# Patient Record
Sex: Female | Born: 1935 | Race: Black or African American | Hispanic: No | Marital: Single | Smoking: Never smoker
Health system: Southern US, Community
[De-identification: ages and names within clinical notes are randomized; demographics above are authoritative.]

## PROBLEM LIST (undated history)

## (undated) DIAGNOSIS — F0390 Unspecified dementia without behavioral disturbance: Secondary | ICD-10-CM

## (undated) DIAGNOSIS — I509 Heart failure, unspecified: Secondary | ICD-10-CM

## (undated) DIAGNOSIS — D696 Thrombocytopenia, unspecified: Secondary | ICD-10-CM

## (undated) DIAGNOSIS — I442 Atrioventricular block, complete: Secondary | ICD-10-CM

## (undated) DIAGNOSIS — E039 Hypothyroidism, unspecified: Secondary | ICD-10-CM

## (undated) DIAGNOSIS — R410 Disorientation, unspecified: Secondary | ICD-10-CM

## (undated) DIAGNOSIS — C55 Malignant neoplasm of uterus, part unspecified: Secondary | ICD-10-CM

## (undated) DIAGNOSIS — E785 Hyperlipidemia, unspecified: Secondary | ICD-10-CM

## (undated) DIAGNOSIS — I4891 Unspecified atrial fibrillation: Secondary | ICD-10-CM

## (undated) DIAGNOSIS — D539 Nutritional anemia, unspecified: Secondary | ICD-10-CM

## (undated) DIAGNOSIS — E876 Hypokalemia: Secondary | ICD-10-CM

## (undated) DIAGNOSIS — Z90710 Acquired absence of both cervix and uterus: Secondary | ICD-10-CM

## (undated) DIAGNOSIS — K219 Gastro-esophageal reflux disease without esophagitis: Secondary | ICD-10-CM

## (undated) HISTORY — DX: Acquired absence of both cervix and uterus: Z90.710

## (undated) HISTORY — DX: Nutritional anemia, unspecified: D53.9

## (undated) HISTORY — DX: Hypokalemia: E87.6

## (undated) HISTORY — DX: Thrombocytopenia, unspecified: D69.6

## (undated) HISTORY — DX: Hypothyroidism, unspecified: E03.9

## (undated) HISTORY — DX: Atrioventricular block, complete: I44.2

## (undated) HISTORY — DX: Heart failure, unspecified: I50.9

## (undated) HISTORY — DX: Unspecified dementia, unspecified severity, without behavioral disturbance, psychotic disturbance, mood disturbance, and anxiety: F03.90

## (undated) HISTORY — DX: Unspecified atrial fibrillation: I48.91

## (undated) HISTORY — DX: Hyperlipidemia, unspecified: E78.5

## (undated) HISTORY — DX: Malignant neoplasm of uterus, part unspecified: C55

## (undated) HISTORY — DX: Disorientation, unspecified: R41.0

## (undated) HISTORY — DX: Gastro-esophageal reflux disease without esophagitis: K21.9

## (undated) HISTORY — PX: CATARACT EXTRACTION: SUR2

---

## 1998-01-26 ENCOUNTER — Other Ambulatory Visit: Admission: RE | Admit: 1998-01-26 | Discharge: 1998-01-26 | Payer: Self-pay | Admitting: Hematology and Oncology

## 1999-02-21 ENCOUNTER — Encounter: Payer: Self-pay | Admitting: Emergency Medicine

## 1999-02-21 ENCOUNTER — Emergency Department (HOSPITAL_COMMUNITY): Admission: EM | Admit: 1999-02-21 | Discharge: 1999-02-21 | Payer: Self-pay | Admitting: Emergency Medicine

## 2000-01-07 ENCOUNTER — Emergency Department (HOSPITAL_COMMUNITY): Admission: EM | Admit: 2000-01-07 | Discharge: 2000-01-07 | Payer: Self-pay | Admitting: Emergency Medicine

## 2000-01-31 ENCOUNTER — Ambulatory Visit (HOSPITAL_COMMUNITY): Admission: RE | Admit: 2000-01-31 | Discharge: 2000-01-31 | Payer: Self-pay | Admitting: Cardiology

## 2000-01-31 ENCOUNTER — Encounter: Payer: Self-pay | Admitting: Cardiology

## 2001-01-09 ENCOUNTER — Encounter: Payer: Self-pay | Admitting: Emergency Medicine

## 2001-01-09 ENCOUNTER — Emergency Department (HOSPITAL_COMMUNITY): Admission: EM | Admit: 2001-01-09 | Discharge: 2001-01-09 | Payer: Self-pay | Admitting: Emergency Medicine

## 2001-01-25 ENCOUNTER — Encounter: Payer: Self-pay | Admitting: Ophthalmology

## 2001-01-28 ENCOUNTER — Ambulatory Visit (HOSPITAL_COMMUNITY): Admission: RE | Admit: 2001-01-28 | Discharge: 2001-01-28 | Payer: Self-pay | Admitting: Ophthalmology

## 2001-03-19 ENCOUNTER — Ambulatory Visit (HOSPITAL_COMMUNITY): Admission: RE | Admit: 2001-03-19 | Discharge: 2001-03-19 | Payer: Self-pay | Admitting: Ophthalmology

## 2001-09-08 HISTORY — PX: PACEMAKER INSERTION: SHX728

## 2002-07-25 ENCOUNTER — Inpatient Hospital Stay (HOSPITAL_COMMUNITY): Admission: AD | Admit: 2002-07-25 | Discharge: 2002-07-27 | Payer: Self-pay | Admitting: Cardiology

## 2002-07-25 ENCOUNTER — Encounter: Payer: Self-pay | Admitting: Cardiology

## 2002-07-27 ENCOUNTER — Encounter: Payer: Self-pay | Admitting: Internal Medicine

## 2003-03-06 ENCOUNTER — Ambulatory Visit (HOSPITAL_COMMUNITY): Admission: RE | Admit: 2003-03-06 | Discharge: 2003-03-06 | Payer: Self-pay | Admitting: Cardiology

## 2003-03-09 HISTORY — PX: CARDIAC CATHETERIZATION: SHX172

## 2003-03-15 ENCOUNTER — Ambulatory Visit (HOSPITAL_COMMUNITY): Admission: RE | Admit: 2003-03-15 | Discharge: 2003-03-15 | Payer: Self-pay | Admitting: Cardiology

## 2003-03-15 ENCOUNTER — Encounter: Payer: Self-pay | Admitting: Cardiology

## 2003-03-28 ENCOUNTER — Ambulatory Visit (HOSPITAL_COMMUNITY): Admission: RE | Admit: 2003-03-28 | Discharge: 2003-03-28 | Payer: Self-pay | Admitting: Cardiology

## 2003-04-26 ENCOUNTER — Encounter: Payer: Self-pay | Admitting: Ophthalmology

## 2003-04-28 ENCOUNTER — Ambulatory Visit (HOSPITAL_COMMUNITY): Admission: RE | Admit: 2003-04-28 | Discharge: 2003-04-28 | Payer: Self-pay | Admitting: Ophthalmology

## 2004-07-15 ENCOUNTER — Ambulatory Visit: Payer: Self-pay

## 2005-03-22 ENCOUNTER — Emergency Department (HOSPITAL_COMMUNITY): Admission: EM | Admit: 2005-03-22 | Discharge: 2005-03-22 | Payer: Self-pay | Admitting: Emergency Medicine

## 2005-03-31 ENCOUNTER — Encounter: Admission: RE | Admit: 2005-03-31 | Discharge: 2005-03-31 | Payer: Self-pay | Admitting: Family Medicine

## 2005-04-08 ENCOUNTER — Ambulatory Visit: Payer: Self-pay | Admitting: Internal Medicine

## 2005-04-09 ENCOUNTER — Inpatient Hospital Stay (HOSPITAL_COMMUNITY): Admission: EM | Admit: 2005-04-09 | Discharge: 2005-04-11 | Payer: Self-pay | Admitting: Emergency Medicine

## 2005-05-08 ENCOUNTER — Ambulatory Visit: Payer: Self-pay | Admitting: Internal Medicine

## 2005-06-05 ENCOUNTER — Encounter: Admission: RE | Admit: 2005-06-05 | Discharge: 2005-06-05 | Payer: Self-pay | Admitting: Family Medicine

## 2005-07-01 ENCOUNTER — Ambulatory Visit (HOSPITAL_COMMUNITY): Admission: RE | Admit: 2005-07-01 | Discharge: 2005-07-01 | Payer: Self-pay | Admitting: Family Medicine

## 2005-07-15 ENCOUNTER — Encounter: Admission: RE | Admit: 2005-07-15 | Discharge: 2005-07-15 | Payer: Self-pay | Admitting: Family Medicine

## 2005-07-29 ENCOUNTER — Ambulatory Visit (HOSPITAL_COMMUNITY): Admission: RE | Admit: 2005-07-29 | Discharge: 2005-07-29 | Payer: Self-pay | Admitting: Family Medicine

## 2005-10-20 ENCOUNTER — Emergency Department (HOSPITAL_COMMUNITY): Admission: EM | Admit: 2005-10-20 | Discharge: 2005-10-20 | Payer: Self-pay | Admitting: Emergency Medicine

## 2005-10-21 ENCOUNTER — Encounter: Admission: RE | Admit: 2005-10-21 | Discharge: 2005-10-21 | Payer: Self-pay | Admitting: Family Medicine

## 2006-01-08 ENCOUNTER — Ambulatory Visit: Payer: Self-pay | Admitting: Internal Medicine

## 2006-01-13 ENCOUNTER — Emergency Department (HOSPITAL_COMMUNITY): Admission: EM | Admit: 2006-01-13 | Discharge: 2006-01-13 | Payer: Self-pay | Admitting: Emergency Medicine

## 2006-03-09 ENCOUNTER — Ambulatory Visit: Payer: Self-pay | Admitting: Internal Medicine

## 2006-05-28 ENCOUNTER — Encounter: Payer: Self-pay | Admitting: Vascular Surgery

## 2006-05-28 ENCOUNTER — Ambulatory Visit (HOSPITAL_COMMUNITY): Admission: RE | Admit: 2006-05-28 | Discharge: 2006-05-28 | Payer: Self-pay | Admitting: Family Medicine

## 2006-06-25 ENCOUNTER — Ambulatory Visit (HOSPITAL_COMMUNITY): Admission: RE | Admit: 2006-06-25 | Discharge: 2006-06-25 | Payer: Self-pay | Admitting: Family Medicine

## 2006-09-22 ENCOUNTER — Ambulatory Visit: Payer: Self-pay | Admitting: Internal Medicine

## 2006-10-06 ENCOUNTER — Encounter: Payer: Self-pay | Admitting: Cardiology

## 2006-10-06 ENCOUNTER — Ambulatory Visit: Payer: Self-pay

## 2008-09-21 ENCOUNTER — Encounter (INDEPENDENT_AMBULATORY_CARE_PROVIDER_SITE_OTHER): Payer: Self-pay | Admitting: *Deleted

## 2008-12-08 ENCOUNTER — Emergency Department (HOSPITAL_COMMUNITY): Admission: EM | Admit: 2008-12-08 | Discharge: 2008-12-08 | Payer: Self-pay | Admitting: Emergency Medicine

## 2009-01-15 ENCOUNTER — Encounter (INDEPENDENT_AMBULATORY_CARE_PROVIDER_SITE_OTHER): Payer: Self-pay | Admitting: *Deleted

## 2009-01-15 ENCOUNTER — Ambulatory Visit: Payer: Self-pay | Admitting: *Deleted

## 2009-01-15 ENCOUNTER — Ambulatory Visit: Payer: Self-pay | Admitting: Cardiology

## 2009-01-15 ENCOUNTER — Inpatient Hospital Stay (HOSPITAL_COMMUNITY): Admission: EM | Admit: 2009-01-15 | Discharge: 2009-01-18 | Payer: Self-pay | Admitting: Emergency Medicine

## 2009-01-17 ENCOUNTER — Encounter (INDEPENDENT_AMBULATORY_CARE_PROVIDER_SITE_OTHER): Payer: Self-pay | Admitting: *Deleted

## 2009-02-16 ENCOUNTER — Emergency Department (HOSPITAL_COMMUNITY): Admission: EM | Admit: 2009-02-16 | Discharge: 2009-02-16 | Payer: Self-pay | Admitting: Emergency Medicine

## 2009-08-03 ENCOUNTER — Emergency Department (HOSPITAL_COMMUNITY): Admission: EM | Admit: 2009-08-03 | Discharge: 2009-08-03 | Payer: Self-pay | Admitting: Emergency Medicine

## 2009-10-11 ENCOUNTER — Ambulatory Visit (HOSPITAL_COMMUNITY): Admission: RE | Admit: 2009-10-11 | Discharge: 2009-10-11 | Payer: Self-pay | Admitting: Geriatric Medicine

## 2009-11-14 ENCOUNTER — Inpatient Hospital Stay (HOSPITAL_COMMUNITY): Admission: EM | Admit: 2009-11-14 | Discharge: 2009-11-16 | Payer: Self-pay | Admitting: Emergency Medicine

## 2010-04-11 ENCOUNTER — Emergency Department (HOSPITAL_COMMUNITY): Admission: EM | Admit: 2010-04-11 | Discharge: 2010-04-11 | Payer: Self-pay | Admitting: Emergency Medicine

## 2010-04-26 ENCOUNTER — Encounter (INDEPENDENT_AMBULATORY_CARE_PROVIDER_SITE_OTHER): Payer: Self-pay | Admitting: *Deleted

## 2010-06-06 ENCOUNTER — Encounter: Payer: Self-pay | Admitting: Internal Medicine

## 2010-06-07 ENCOUNTER — Emergency Department (HOSPITAL_COMMUNITY): Admission: EM | Admit: 2010-06-07 | Discharge: 2010-06-07 | Payer: Self-pay | Admitting: Emergency Medicine

## 2010-07-05 ENCOUNTER — Encounter (INDEPENDENT_AMBULATORY_CARE_PROVIDER_SITE_OTHER): Payer: Self-pay | Admitting: *Deleted

## 2010-08-20 ENCOUNTER — Ambulatory Visit: Payer: Self-pay | Admitting: Internal Medicine

## 2010-08-20 DIAGNOSIS — D539 Nutritional anemia, unspecified: Secondary | ICD-10-CM | POA: Insufficient documentation

## 2010-08-20 DIAGNOSIS — E039 Hypothyroidism, unspecified: Secondary | ICD-10-CM

## 2010-08-20 DIAGNOSIS — R404 Transient alteration of awareness: Secondary | ICD-10-CM

## 2010-08-20 DIAGNOSIS — I4891 Unspecified atrial fibrillation: Secondary | ICD-10-CM

## 2010-08-20 DIAGNOSIS — E876 Hypokalemia: Secondary | ICD-10-CM | POA: Insufficient documentation

## 2010-08-20 DIAGNOSIS — D696 Thrombocytopenia, unspecified: Secondary | ICD-10-CM | POA: Insufficient documentation

## 2010-08-20 DIAGNOSIS — Z950 Presence of cardiac pacemaker: Secondary | ICD-10-CM

## 2010-08-20 DIAGNOSIS — E785 Hyperlipidemia, unspecified: Secondary | ICD-10-CM | POA: Insufficient documentation

## 2010-08-20 DIAGNOSIS — K219 Gastro-esophageal reflux disease without esophagitis: Secondary | ICD-10-CM | POA: Insufficient documentation

## 2010-10-08 ENCOUNTER — Other Ambulatory Visit: Payer: Self-pay | Admitting: Geriatric Medicine

## 2010-10-08 DIAGNOSIS — Z780 Asymptomatic menopausal state: Secondary | ICD-10-CM

## 2010-10-10 NOTE — Cardiovascular Report (Signed)
Summary: Certified Letter Signed - Other  Certified Letter Signed - Other   Imported By: Debby Freiberg 07/23/2010 11:25:21  _____________________________________________________________________  External Attachment:    Type:   Image     Comment:   External Document

## 2010-10-10 NOTE — Assessment & Plan Note (Signed)
Summary: device check/mt   Visit Type:  Follow-up   History of Present Illness: [name] returns today for followup. She is a pleasant elderly woman with a h/o symptomatic bradycardia, s/p PPM.  She has a h/o atrial fibrillation with a paced ventricular rate.  She denies syncope, c/p or sob.  Current Medications (verified): 1)  Multivitamins   Tabs (Multiple Vitamin) .... Once Daily 2)  Triamcinolone Acetonide 0.025 % Crea (Triamcinolone Acetonide) .... Uad 3)  Potassium Chloride Crys Cr 20 Meq Cr-Tabs (Potassium Chloride Crys Cr) .... Take One Tablet By Mouth Daily 4)  Iron 246 (28 Fe) Mg Tabs (Ferrous Gluconate) .... Uad 5)  Cvs Cranberry 475 Mg Caps (Cranberry) .... Uad 6)  Artificial Tears  Soln (Artificial Tear Solution) .... Uad 7)  Refresh .... Uad 8)  Donepezil Hcl 5 Mg Tabs (Donepezil Hcl) .... Once Daily 9)  Glycolax  Powd (Polyethylene Glycol 3350) .... Uad 10)  Nexium 40 Mg Cpdr (Esomeprazole Magnesium) .... Once Daily 11)  Synthroid 50 Mcg Tabs (Levothyroxine Sodium) .... Once Daily 12)  Flonase 50 Mcg/act Susp (Fluticasone Propionate) .... Uad 13)  Vitamin D3 1000 Unit Tabs (Cholecalciferol) .... Once Daily 14)  Tramadol Hcl 50 Mg Tabs (Tramadol Hcl) .... As Needed  Allergies (verified): No Known Drug Allergies  Past History:  Past Medical History: Last updated: 08/20/2010 Current Problems:  GERD (ICD-530.81) DYSLIPIDEMIA (ICD-272.4) FIBRILLATION, ATRIAL (ICD-427.31) THROMBOCYTOPENIA (ICD-287.5) ANEMIA, MACROCYTIC (ICD-281.9) HYPOKALEMIA (ICD-276.8) HYPOTHYROIDISM (ICD-244.9) DELIRIUM (ICD-780.09)    Review of Systems  The patient denies chest pain, syncope, dyspnea on exertion, and peripheral edema.    Vital Signs:  Patient profile:   77 year old female Weight:      128 pounds Pulse rate:   72 / minute BP sitting:   118 / 62  (left arm)  Vitals Entered By: Laurance Flatten CMA (August 20, 2010 3:23 PM)  Physical Exam  General:  Elderly, well  developed, well nourished, in no acute distress.  HEENT: normal Neck: supple. No JVD. Carotids 2+ bilaterally no bruits Cor: RRR no rubs, gallops or murmur Lungs: CTA Ab: soft, nontender. nondistended. No HSM. Good bowel sounds Ext: warm. no cyanosis, clubbing or edema Neuro: alert and oriented. Grossly nonfocal. affect pleasant    PPM Specifications Following MD:  Lewayne Bunting, MD     PPM Vendor:  [employer]onic     PPM Model Number:  [id number]     PPM Serial Number:  [id number] PPM DOI:  08/05/2002      Lead 1    Location: RA     DOI: 08/05/2002     Model #: [id number]     Serial #: [id number]     Status: active Lead 2    Location: RV     DOI: 08/05/2002     Model #: [id number]     Serial #: [id number]     Status: active  Magnet Response Rate:  BOL 85 ERI 65  Indications:  CHB   PPM Follow Up Remote Check?  No Battery Voltage:  2.74 V     Battery Est. Longevity:  25 months     Pacer Dependent:  Yes       PPM Device Measurements Atrium  Amplitude: 1.0 mV, Impedance: 516 ohms,  Right Ventricle  Impedance: 469 ohms, Threshold: 1.0 V at 0.4 msec  Episodes MS Episodes:  22     Percent Mode Switch:  100%     Ventricular High Rate:  1     Atrial Pacing:  36.2%     Ventricular Pacing:  99.5%  Parameters Mode:  DDD     Lower Rate Limit:  60     Upper Rate Limit:  110 Paced AV Delay:  150     Sensed AV Delay:  120 Next Cardiology Appt Due:  02/07/2011 Tech Comments:  No parameter changes.  Device function normal.   A-fib, - coumadin.  ROV 6 months clinic. Altha Harm, LPN  August 20, 2010 3:40 PM  MD Comments:  Agree with above.  Impression & Recommendations:  Problem # 1:  FIBRILLATION, ATRIAL (ICD-427.31) Her ventricular rate is well controlled. She is not a coumadin candidate.  Problem # 2:  CARDIAC PACEMAKER IN SITU (ICD-V45.01) Her device is working normally.  Will follow in several months.  Patient Instructions: 1)  Your physician wants you to follow-up in:12 months with Dr Court Joy  will receive a reminder letter in the mail two months in advance. If you don't receive a letter, please call our office to schedule the follow-up appointment.

## 2010-10-10 NOTE — Letter (Signed)
Summary: Device-Delinquent Check  [facility], Main Office  [address]   [place], [place] [address]   Phone: [phone]  Fax: [phone]     April 26, 2010 MRN: [id number]   [facility] [place] [address] [place], [place]  [address]   Dear Ms. Whichard,  According to our records, you have not had your implanted device checked in the recommended period of time.  We are unable to determine appropriate device function without checking your device on a regular basis.  Please call our office to schedule an appointment, with Dr. Ladona Ridgel,  as soon as possible.  If you are having your device checked by another physician, please call us so that we may update our records.  Thank you,  Altha Harm, LPN  April 26, 2010 4:10 PM  [facility]  certified mail

## 2010-10-10 NOTE — Cardiovascular Report (Signed)
Summary: Office Visit   Office Visit   Imported By: Roderic Ovens 08/28/2010 09:28:50  _____________________________________________________________________  External Attachment:    Type:   Image     Comment:   External Document

## 2010-10-10 NOTE — Letter (Signed)
Summary: Device-Delinquent Check  [facility], Main Office  [address]   [place], [place] [address]   Phone: [phone]  Fax: [phone]     July 05, 2010 MRN: [id number]   [facility] [place] [address] [place], [place]  [address]   Dear Ms. Cordova,  According to our records, you have not had your implanted device checked in the recommended period of time.  We are unable to determine appropriate device function without checking your device on a regular basis.  Please call our office to schedule an appointment, with Dr Ladona Ridgel,  as soon as possible.  If you are having your device checked by another physician, please call us so that we may update our records.  Thank you,  Letta Moynahan, EMT  July 05, 2010 11:38 AM  [facility]

## 2010-10-10 NOTE — Letter (Signed)
Summary: [facility]   Imported By: Marylou Mccoy 09/05/2010 16:25:47  _____________________________________________________________________  External Attachment:    Type:   Image     Comment:   External Document

## 2010-10-16 ENCOUNTER — Ambulatory Visit
Admission: RE | Admit: 2010-10-16 | Discharge: 2010-10-16 | Disposition: A | Discharge status: Home / Self Care | Payer: Medicaid Other | Source: Ambulatory Visit | Attending: Geriatric Medicine | Admitting: Geriatric Medicine

## 2010-10-16 DIAGNOSIS — Z780 Asymptomatic menopausal state: Secondary | ICD-10-CM

## 2010-11-29 LAB — BASIC METABOLIC PANEL
BUN: 10 mg/dL (ref 6–23)
BUN: 15 mg/dL (ref 6–23)
CO2: 26 mEq/L (ref 19–32)
Calcium: 9.3 mg/dL (ref 8.4–10.5)
Calcium: 9.3 mg/dL (ref 8.4–10.5)
Chloride: 104 mEq/L (ref 96–112)
GFR calc Af Amer: >60 mL/min (ref >60)
GFR calc Af Amer: >60 mL/min (ref >60)
GFR calc non Af Amer: 50 mL/min — ABNORMAL LOW (ref >60)
GFR calc non Af Amer: 51 mL/min — ABNORMAL LOW (ref >60)
Glucose, Bld: 101 mg/dL — ABNORMAL HIGH (ref 70–99)
Glucose, Bld: 119 mg/dL — ABNORMAL HIGH (ref 70–99)
Potassium: 2.6 mEq/L — CL (ref 3.5–5.1)
Potassium: 3.5 mEq/L (ref 3.5–5.1)
Potassium: 3.6 mEq/L (ref 3.5–5.1)
Sodium: 136 mEq/L (ref 135–145)
Sodium: 144 mEq/L (ref 135–145)
Sodium: 144 mEq/L (ref 135–145)

## 2010-11-29 LAB — CBC
HCT: 34.4 % — ABNORMAL LOW (ref 36.0–46.0)
HCT: 35.7 % — ABNORMAL LOW (ref 36.0–46.0)
HCT: 36.8 % (ref 36.0–46.0)
Hemoglobin: 12.1 g/dL (ref 12.0–15.0)
Hemoglobin: 12.7 g/dL (ref 12.0–15.0)
MCHC: 35 g/dL (ref 30.0–36.0)
MCV: 107.8 fL — ABNORMAL HIGH (ref 78.0–100.0)
Platelets: 115 10*3/uL — ABNORMAL LOW (ref 150–400)
Platelets: 119 10*3/uL — ABNORMAL LOW (ref 150–400)
RBC: 3.23 MIL/uL — ABNORMAL LOW (ref 3.87–5.11)
RBC: 3.42 MIL/uL — ABNORMAL LOW (ref 3.87–5.11)
RDW: 14.8 % (ref 11.5–15.5)
RDW: 15.1 % (ref 11.5–15.5)
WBC: 4.2 10*3/uL (ref 4.0–10.5)
WBC: 4.4 10*3/uL (ref 4.0–10.5)

## 2010-11-29 LAB — DIFFERENTIAL
Basophils Absolute: 0 10*3/uL (ref 0.0–0.1)
Eosinophils Relative: 0 % (ref 0–5)
Lymphocytes Relative: 28 % (ref 12–46)
Lymphs Abs: 1.2 10*3/uL (ref 0.7–4.0)
Neutro Abs: 2.7 10*3/uL (ref 1.7–7.7)
Neutrophils Relative %: 64 % (ref 43–77)

## 2010-11-29 LAB — URINE CULTURE: Colony Count: 75000

## 2010-11-29 LAB — URINE MICROSCOPIC-ADD ON

## 2010-11-29 LAB — URINALYSIS, ROUTINE W REFLEX MICROSCOPIC
Nitrite: NEGATIVE
Protein, ur: NEGATIVE mg/dL
Specific Gravity, Urine: 1.012 (ref 1.005–1.030)
Urobilinogen, UA: 1 mg/dL (ref 0.0–1.0)

## 2010-11-29 LAB — TSH: TSH: 1.333 u[IU]/mL (ref 0.350–4.500)

## 2010-12-11 LAB — DIFFERENTIAL
Basophils Absolute: 0 10*3/uL (ref 0.0–0.1)
Basophils Relative: 0 % (ref 0–1)
Eosinophils Absolute: 0 10*3/uL (ref 0.0–0.7)
Monocytes Absolute: 0.3 10*3/uL (ref 0.1–1.0)
Monocytes Relative: 8 % (ref 3–12)

## 2010-12-11 LAB — URINALYSIS, ROUTINE W REFLEX MICROSCOPIC
Bilirubin Urine: NEGATIVE
Hgb urine dipstick: NEGATIVE
Ketones, ur: NEGATIVE mg/dL
Protein, ur: NEGATIVE mg/dL
Urobilinogen, UA: 1 mg/dL (ref 0.0–1.0)

## 2010-12-11 LAB — CBC
Hemoglobin: 12.7 g/dL (ref 12.0–15.0)
MCHC: 35.3 g/dL (ref 30.0–36.0)
MCV: 104.8 fL — ABNORMAL HIGH (ref 78.0–100.0)
RBC: 3.43 MIL/uL — ABNORMAL LOW (ref 3.87–5.11)
RDW: 14.9 % (ref 11.5–15.5)

## 2010-12-11 LAB — BASIC METABOLIC PANEL
CO2: 29 mEq/L (ref 19–32)
Chloride: 102 mEq/L (ref 96–112)
Creatinine, Ser: 1.11 mg/dL (ref 0.4–1.2)
GFR calc Af Amer: 57 mL/min — ABNORMAL LOW (ref >60)
Glucose, Bld: 104 mg/dL — ABNORMAL HIGH (ref 70–99)
Sodium: 137 mEq/L (ref 135–145)

## 2010-12-14 IMAGING — CT CT HEAD W/O CM
1 series · 16 of 30 positions shown, 20 images · non-contrast
Comparison: Head CT scan 04/29/2005.

CLINICAL DATA: Slurred speech.  Code stroke patient.

CT HEAD WITHOUT CONTRAST
TECHNIQUE: Contiguous axial images were obtained from the base of
the skull through the vertex without contrast.

[Series 2: head routine 4.8 h37s · axial · 0.43mm/px · z∈[+1184,+1341]mm · 16 of 36 slices shown, 20 images]
[im 2/36  brain]
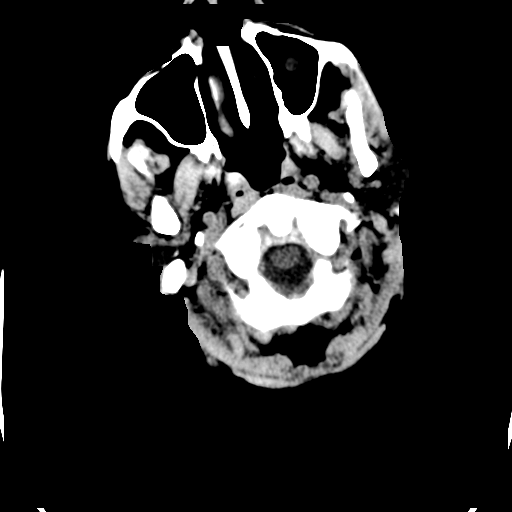
[im 2/36  bone]
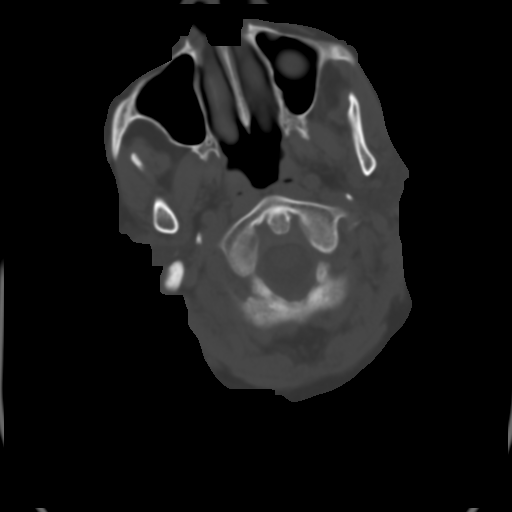
[im 4/36  brain]
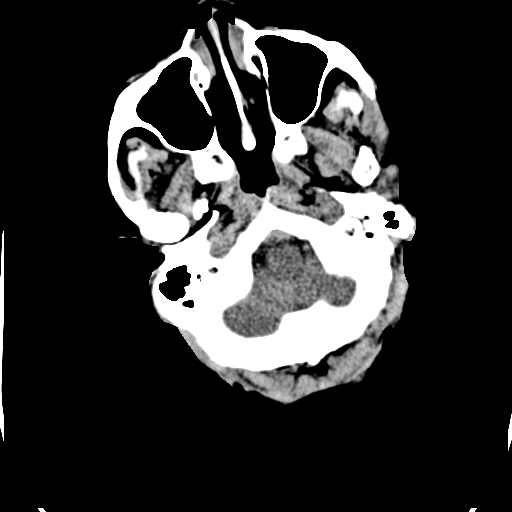
[im 7/36  brain]
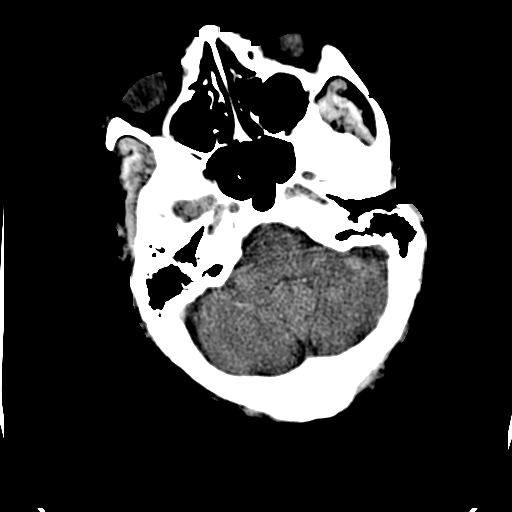
[im 9/36  brain]
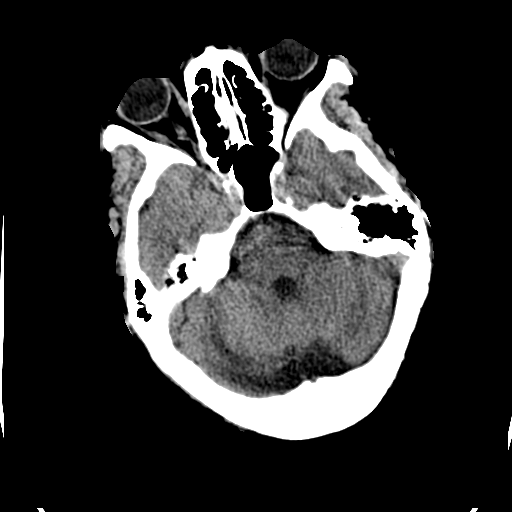
[im 10/36  brain]
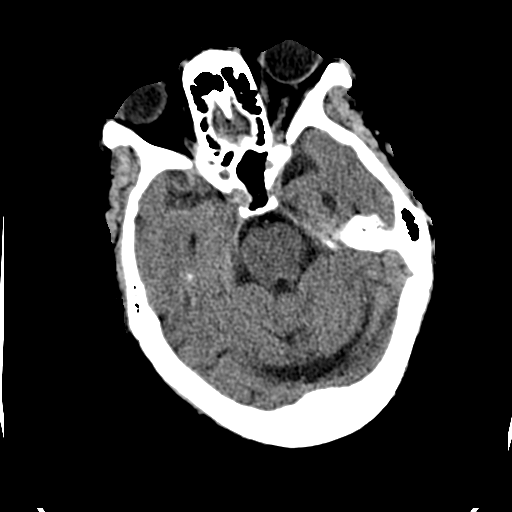
[im 10/36  bone]
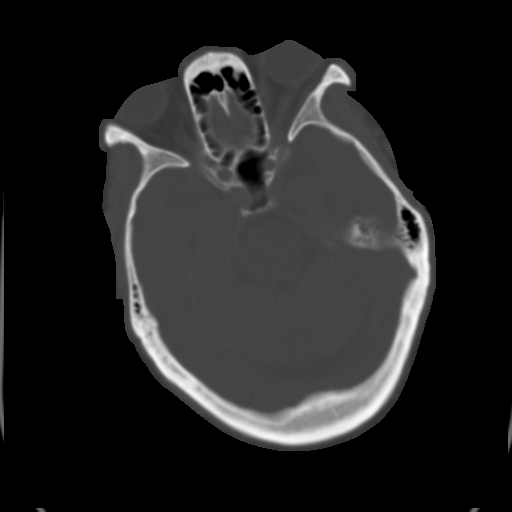
[im 13/36  brain]
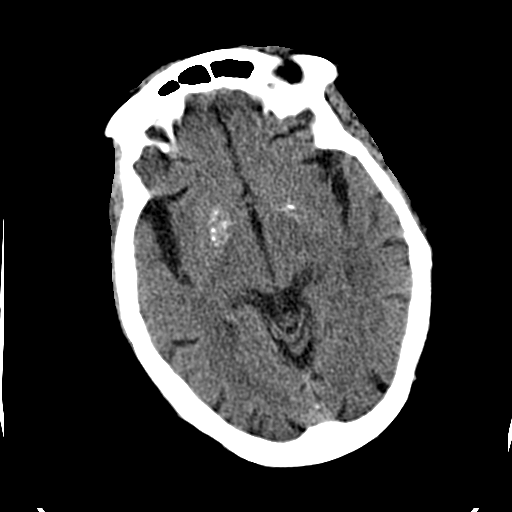
[im 15/36  brain]
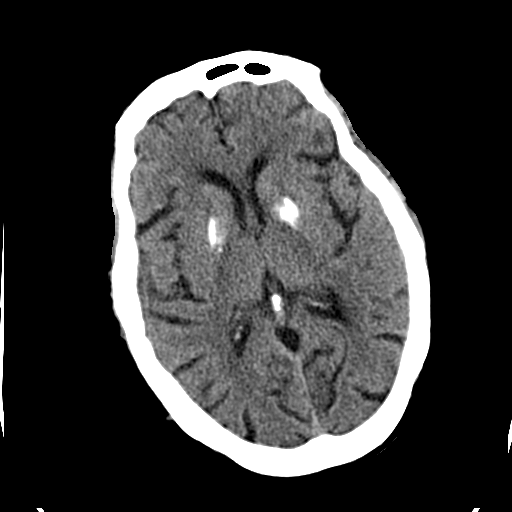
[im 17/36  brain]
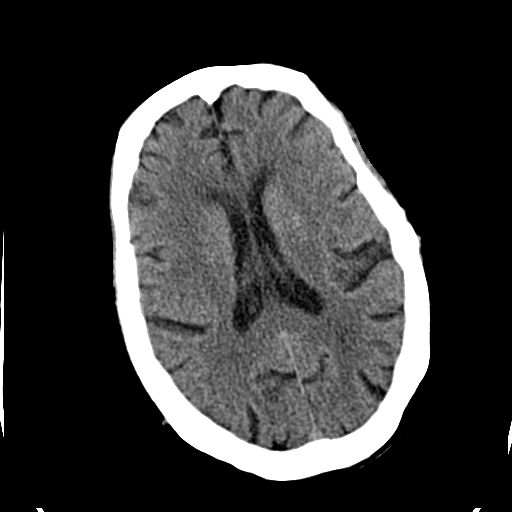
[im 19/36  brain]
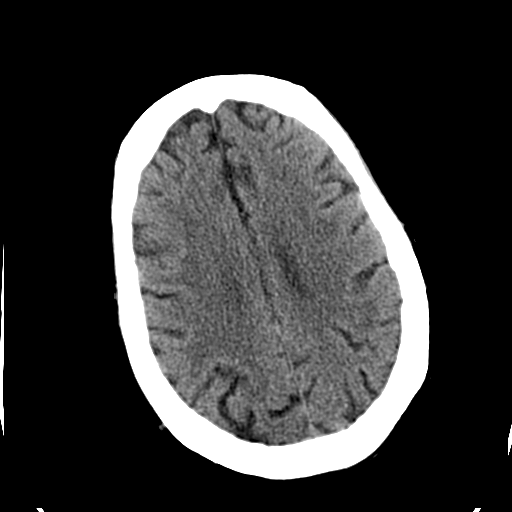
[im 19/36  bone]
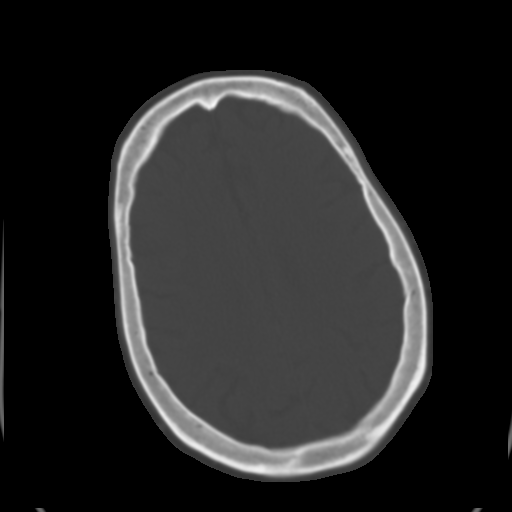
[im 21/36  brain]
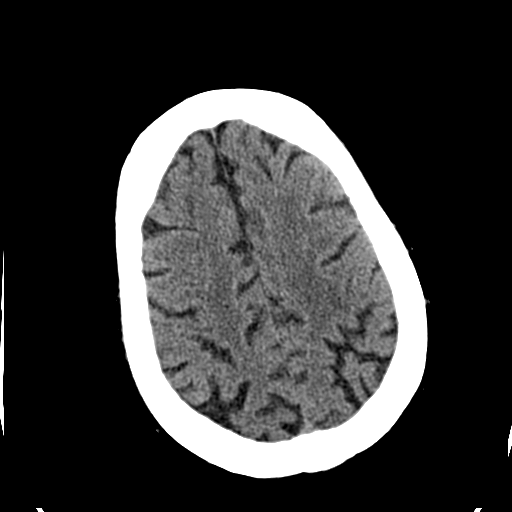
[im 23/36  brain]
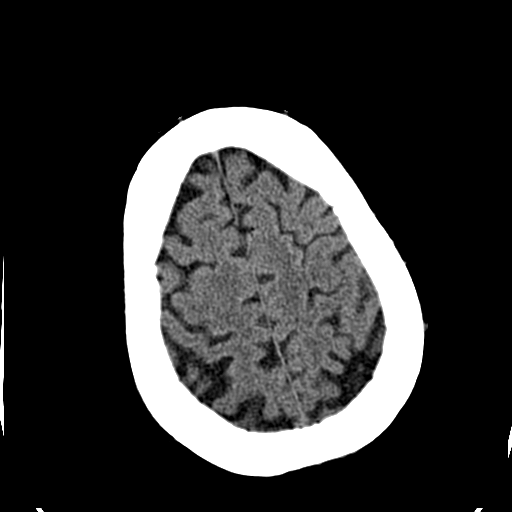
[im 26/36  brain]
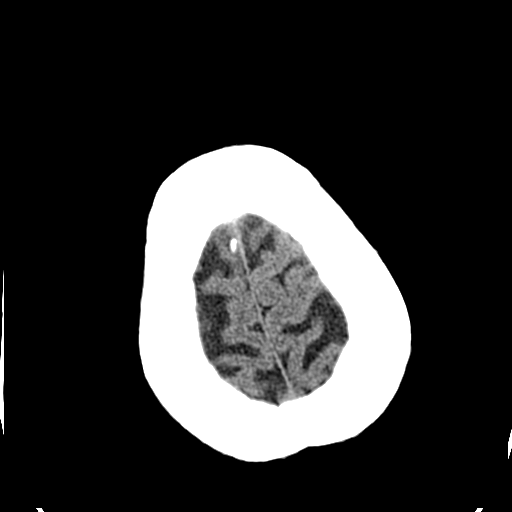
[im 27/36  brain]
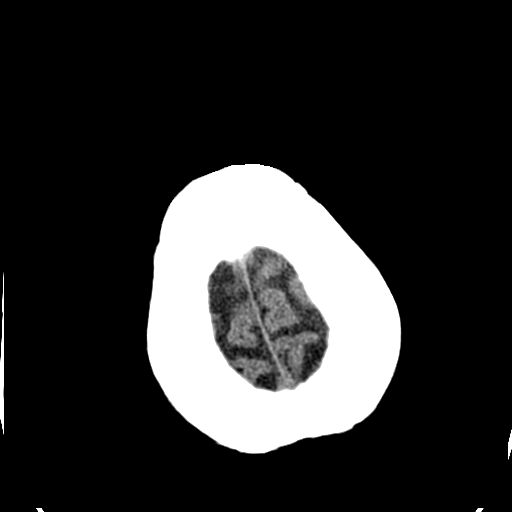
[im 27/36  bone]
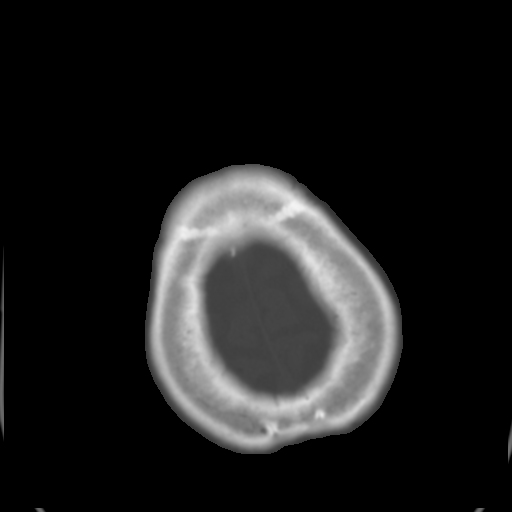
[im 29/36  brain]
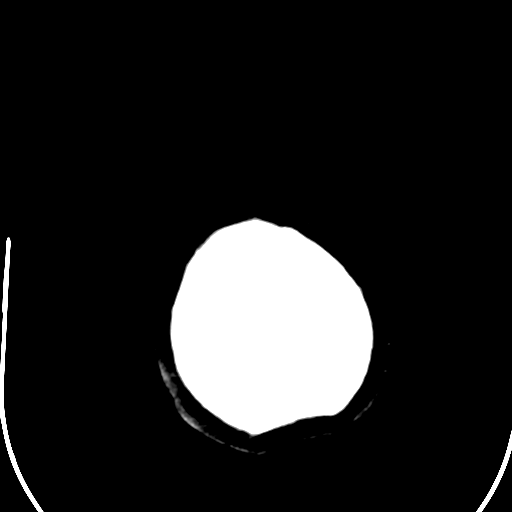
[im 32/36  brain]
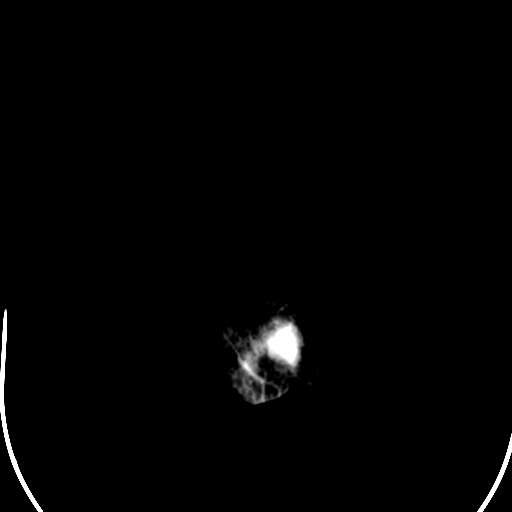
[im 34/36  brain]
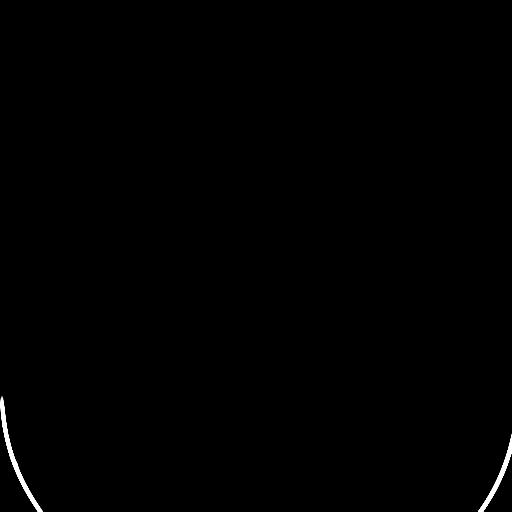

[16 of 30 positions shown; findings below may reference images not displayed]

FINDINGS: The brain is atrophic with some chronic microvascular
ischemic change.  Dense bilateral basal ganglia calcifications
again noted.  No evidence of acute intracranial abnormality
including acute infarction, hemorrhage, mass lesion, mass effect,
midline shift or abnormal extra-axial fluid collection.  Mucous
retention cyst or polyp left maxillary sinus noted.
IMPRESSION: 1.  No acute intracranial abnormality.
2.  Atrophy and chronic microvascular ischemic change.
3.  Left maxillary mucous retention cyst or polyp.

## 2010-12-14 IMAGING — CR DG CHEST 1V PORT
1 series · 1 of 1 positions shown · non-contrast
Comparison: 06/25/2006

CLINICAL DATA: Generalized weakness.  History of congestive heart
failure.  Code stroke.

PORTABLE CHEST - 1 VIEW

[view not recorded]
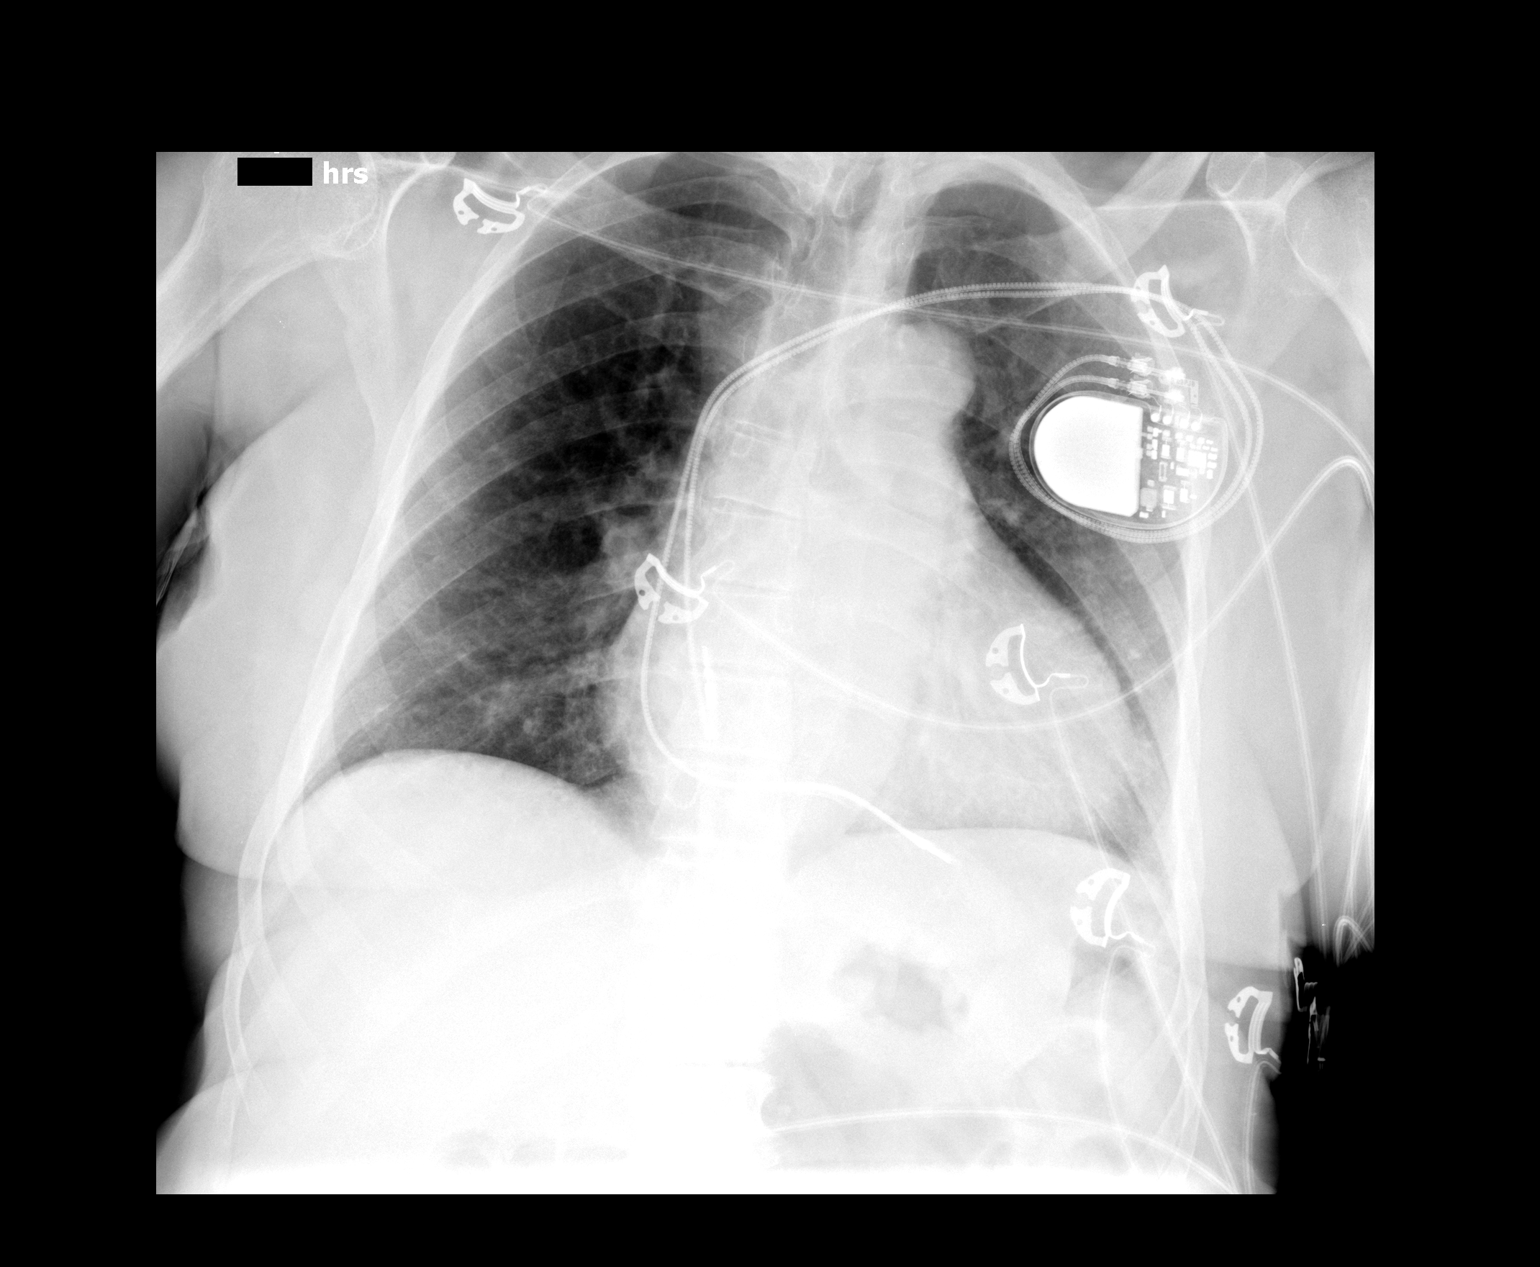

[1 of 1 positions shown; findings below may reference images not displayed]

FINDINGS: Dual lead pacer is again noted.  Borderline cardiomegaly
is present.  There is no evidence of edema.  The lungs appear
clear.
IMPRESSION: 1.  Borderline cardiomegaly.  No acute findings.

## 2010-12-16 LAB — POCT I-STAT, CHEM 8
Creatinine, Ser: 1.1 mg/dL (ref 0.4–1.2)
Hemoglobin: 11.9 g/dL — ABNORMAL LOW (ref 12.0–15.0)
Sodium: 136 mEq/L (ref 135–145)
TCO2: 25 mmol/L (ref 0–100)

## 2010-12-16 LAB — CBC
HCT: 35.3 % — ABNORMAL LOW (ref 36.0–46.0)
MCHC: 34.4 g/dL (ref 30.0–36.0)
MCV: 106.2 fL — ABNORMAL HIGH (ref 78.0–100.0)
Platelets: 177 10*3/uL (ref 150–400)
RDW: 14.6 % (ref 11.5–15.5)
WBC: 4.8 10*3/uL (ref 4.0–10.5)

## 2010-12-16 LAB — DIFFERENTIAL
Basophils Relative: 0 % (ref 0–1)
Eosinophils Absolute: 0 10*3/uL (ref 0.0–0.7)
Eosinophils Relative: 0 % (ref 0–5)
Lymphs Abs: 1.7 10*3/uL (ref 0.7–4.0)

## 2010-12-17 LAB — CBC
MCV: 104.9 fL — ABNORMAL HIGH (ref 78.0–100.0)
Platelets: 158 10*3/uL (ref 150–400)
WBC: 5.6 10*3/uL (ref 4.0–10.5)

## 2010-12-17 LAB — BLOOD GAS, ARTERIAL
Acid-base deficit: 0.3 mmol/L (ref 0.0–2.0)
Acid-base deficit: 1.5 mmol/L (ref 0.0–2.0)
Bicarbonate: 22.7 mEq/L (ref 20.0–24.0)
O2 Saturation: 99.6 %
Patient temperature: 98.6
TCO2: 23.6 mmol/L (ref 0–100)
TCO2: 24 mmol/L (ref 0–100)
pCO2 arterial: 39.4 mmHg (ref 35.0–45.0)
pO2, Arterial: 75.9 mmHg — ABNORMAL LOW (ref 80.0–100.0)

## 2010-12-17 LAB — COMPREHENSIVE METABOLIC PANEL
Alkaline Phosphatase: 88 U/L (ref 39–117)
BUN: 17 mg/dL (ref 6–23)
Calcium: 9.2 mg/dL (ref 8.4–10.5)
Creatinine, Ser: 1.14 mg/dL (ref 0.4–1.2)
Glucose, Bld: 113 mg/dL — ABNORMAL HIGH (ref 70–99)
Total Protein: 7.5 g/dL (ref 6.0–8.3)

## 2010-12-17 LAB — URINALYSIS, ROUTINE W REFLEX MICROSCOPIC
Bilirubin Urine: NEGATIVE
Hgb urine dipstick: NEGATIVE
Protein, ur: NEGATIVE mg/dL
Urobilinogen, UA: 1 mg/dL (ref 0.0–1.0)

## 2010-12-17 LAB — CARDIAC PANEL(CRET KIN+CKTOT+MB+TROPI)
Relative Index: 1 (ref 0.0–2.5)
Relative Index: 1.3 (ref 0.0–2.5)
Total CK: 173 U/L (ref 7–177)
Total CK: 397 U/L — ABNORMAL HIGH (ref 7–177)
Troponin I: 0.01 ng/mL (ref 0.00–0.06)
Troponin I: 0.01 ng/mL (ref 0.00–0.06)

## 2010-12-17 LAB — DIFFERENTIAL
Eosinophils Absolute: 0 10*3/uL (ref 0.0–0.7)
Lymphs Abs: 2 10*3/uL (ref 0.7–4.0)
Neutro Abs: 3.1 10*3/uL (ref 1.7–7.7)
Neutrophils Relative %: 54 % (ref 43–77)

## 2010-12-17 LAB — TSH: TSH: 2.308 u[IU]/mL (ref 0.350–4.500)

## 2010-12-17 LAB — PROTIME-INR
INR: 1 (ref 0.00–1.49)
INR: 1 (ref 0.00–1.49)
Prothrombin Time: 13.7 seconds (ref 11.6–15.2)
Prothrombin Time: 13.7 seconds (ref 11.6–15.2)

## 2010-12-17 LAB — TROPONIN I: Troponin I: 0.01 ng/mL (ref 0.00–0.06)

## 2010-12-17 LAB — BRAIN NATRIURETIC PEPTIDE: Pro B Natriuretic peptide (BNP): 52 pg/mL (ref 0.0–100.0)

## 2010-12-17 LAB — CK TOTAL AND CKMB (NOT AT ARMC)
CK, MB: 1.7 ng/mL (ref 0.3–4.0)
Relative Index: 1.2 (ref 0.0–2.5)
Total CK: 147 U/L (ref 7–177)

## 2010-12-17 LAB — GLUCOSE, CAPILLARY: Glucose-Capillary: 103 mg/dL — ABNORMAL HIGH (ref 70–99)

## 2010-12-17 LAB — APTT: aPTT: 32 seconds (ref 24–37)

## 2010-12-17 LAB — VITAMIN B12: Vitamin B-12: 941 pg/mL — ABNORMAL HIGH (ref 211–911)

## 2010-12-17 LAB — CALCIUM: Calcium: 9.4 mg/dL (ref 8.4–10.5)

## 2010-12-18 LAB — POCT I-STAT, CHEM 8
HCT: 41 % (ref 36.0–46.0)
Hemoglobin: 13.9 g/dL (ref 12.0–15.0)
Potassium: 3.5 mEq/L (ref 3.5–5.1)
Sodium: 140 mEq/L (ref 135–145)

## 2010-12-18 LAB — POCT CARDIAC MARKERS
CKMB, poc: 1.3 ng/mL (ref 1.0–8.0)
Myoglobin, poc: 138 ng/mL (ref 12–200)

## 2010-12-18 LAB — CBC
HCT: 39 % (ref 36.0–46.0)
MCV: 105.8 fL — ABNORMAL HIGH (ref 78.0–100.0)
RBC: 3.69 MIL/uL — ABNORMAL LOW (ref 3.87–5.11)
WBC: 4 10*3/uL (ref 4.0–10.5)

## 2010-12-18 LAB — DIFFERENTIAL
Basophils Absolute: 0 10*3/uL (ref 0.0–0.1)
Eosinophils Absolute: 0 10*3/uL (ref 0.0–0.7)
Monocytes Absolute: 0.4 10*3/uL (ref 0.1–1.0)
Neutrophils Relative %: 52 % (ref 43–77)

## 2011-01-21 NOTE — Procedures (Signed)
EEG NUMBER:  [id number]   CLINICAL HISTORY:  The patient is an 77 year old admitted with altered  mental status, TIA, slurred speech, new onset of partial seizure  disorder.  She has a history of dementia, hypothyroidism, congestive  heart failure, she is on a pacemaker, has atrial fibrillation status  post hysterectomy, uterine cancer, dyslipidemia, and gastroesophageal  reflux disease.  The patient's head was trembling prior to the  EEG.(293.0)   PROCEDURE:  The tracing is carried out on a 32-channel digital Cadwell  recorder reformatted into 16 channel montages with one devoted to EKG.  The patient was awake during the recording and drowsy.  The  International 10/20 system lead placement was used.  Medications include  guaifenesin, Protonix, levothyroxine, Zocor, Aricept, potassium  chloride, morphine sulfate, Zofran, and Tylenol.   DESCRIPTION OF FINDINGS:  Dominant frequency is a 15-20 microvolt 9 Hz  alpha range activity.  Background activity is mixed frequency rhythmic  25 microvolt theta range activity that is seen during the waking record,  but becomes dominant frequency with drowsiness.  Light natural sleep was  not achieved.  Photic stimulation induced a driving response at 5 and 7  Hz.   EKG showed a regular sinus rhythm with ventricular response of 66 beats  per minute.   IMPRESSION:  Normal record with the patient awake and drowsy.      Deanna Artis. Sharene Skeans, M.D.  Electronically Signed     [id number]  D:  01/16/2009 82:95:62  T:  01/17/2009 04:50:18  Job #:  [id number]   cc:   Charlestine Massed, MD

## 2011-01-21 NOTE — H&P (Signed)
[name], [name]                ACCOUNT NO.:  [id number]   MEDICAL RECORD NO.:  [id number]          PATIENT TYPE:  INP   LOCATION:  3017                         FACILITY:  [facility]   PHYSICIAN:  Manus Gunning, MD      DATE OF BIRTH:  December 03, 1921   DATE OF ADMISSION:  01/14/2009  DATE OF DISCHARGE:                              HISTORY & PHYSICAL   CHIEF COMPLAINT:  Increasing lethargy and altered mental status with  slurred speech.   HISTORY OF PRESENT ILLNESS:  The patient is an 77 year old African  American female, who is a resident of [facility]  facility, who was brought to the emergency room by her family.  Apparently, this evening the family was visiting with her and she was  doing well but then gradually, as the evening progressed, they reported  a change in her mental status, claiming that approximately 10 p.m., she  had decreased responsiveness, garbled and slurred speech.  This was a  significant deterioration. Apparently at baseline, the patient is able  to converse current events with her family; although of note, she does  have baseline slurring of speech and tremulousness, which the family  does corroborate but claim that her current change in mentation was a  significant deterioration from baseline.  This was corroborated over the  phone with the assisted living facility staff who claim that at  baseline, she is very sharp, does have mild dementia, but essentially  able to function independently.  At the time of my interview, the  patient's speech remained slurred, very difficult to understand but  appropriate, would follow all commands and on speaking with her, and she  did have appropriate responses to questions.  She claimed that she had a  headache and has a history of constipation but apart from that denied  any other complaints.  She denies any fevers, denies sick contacts.  No  recent falls, no recent trauma.  No syncope or presyncope. No  loss of  consciousness, no falls, no seizures.  She denies odynophagia,  dysphagia, chest pain, palpitations, PND, orthopnea.  No shortness of  breath, cough or expectoration.  No dysuria or polyuria, no hematuria.  No bright red blood per rectum, no melenic stools. No diarrhea or  constipation. No overt musculoskeletal complaints.   PAST MEDICAL/SURGICAL HISTORY:  1. CHF.  2. Hypothyroidism.  3. Permanent pacemaker secondary to complete heart block.  4. History of atrial fibrillation.  5. Hysterectomy secondary to uterine cancer.  6. Dyslipidemia.  7. Dementia, mild.  8. GERD.  9. Bilateral cataract surgery.   ALLERGIES:  No known drug allergies.   SOCIAL HISTORY:  Lives at [facility].  Denies tobacco, illicits or  alcohol.   FAMILY HISTORY:  None.   HOME MEDICATIONS:  1. Aspirin 81 mg p.o. daily.  2. Lasix 80 mg daily.  3. Nexium 40 mg daily.  4. Synthroid 50 mcg daily.  5. Zocor 40 mg once a day.  6. Aricept 10 mg at bedtime.  7. Fergon 28 mg twice a day.  8. Vicodin 5/500 mg twice a  day.  9. MiraLax 17 grams as needed.  10.Potassium chloride 20 mEq once a day.  11.Dialyvite once a day.   REVIEW OF SYSTEMS:  A 14-point review of systems is performed. Pertinent  positives and negatives as described above.   PHYSICAL EXAMINATION:  VITAL SIGNS:  At the time of presentation,  temperature 97.8, heart rate 62,  respiratory rate 16, blood pressure  128/65, O2  saturation is 100%.  EKG revealed AV sequential pacing  GENERAL:  Well-developed, well-nourished African American female lying  in bed comfortably in no apparent distress.  HEENT:  Normocephalic, atraumatic.  Moist oral mucosa.  No thrush,  erythema or postnasal drip.  Eyes anicteric.  Extraocular muscles are  intact.  Pupils are equal, react to light and accommodation.  CARDIOVASCULAR:  S1 and S2 normal, regular rate and rhythm.  No murmurs,  rubs or gallops.  RESPIRATORY:  Air entry bilaterally equal.  Mo  rales, rhonchi or wheezes  appreciated.  ABDOMEN:  Soft and nontender.  Nondistended.  Positive bowel sounds. No  hepatosplenomegaly.  EXTREMITIES:  No clubbing, cyanosis, or edema.  Positive bilateral  dorsalis pedis pulses.  CNS:  Alert and oriented x3.  Very difficult to understand secondary to  slurred speech.  Cranial nerves II through XII grossly intact.  Power,  sensation and reflexes bilaterally symmetrical.  Follows all commands.  Babinski bilaterally downgoing.  SKIN:  No breakdown, swelling, ulceration or masses.  HEMATOLOGY/ONCOLOGY:  No palpable lymphadenopathy, ecchymoses, bruising  or petechia.   LABORATORY DATA:  Glucose 103, INR 1.  PTT 32, PT 13.7.  Sodium 142,  potassium 3.5, chloride of 107, carbon dioxide 27, glucose 113, BUN 17,  creatinine 1.1, calcium 9.2. WBC 5,600, hemoglobin 14 and hematocrit of  39.5.  Platelet count is 158, polymorphs 54.  Urinalysis negative.  CT  scan of the head demonstrates no acute intracranial abnormality, atrophy  and chronic microvascular ischemic changes.  Left maxillary mucous  retention cyst  or polyp.  Chest x-ray demonstrates borderline  cardiomegaly, no acute findings.   ASSESSMENT AND PLAN:  1. Altered mental status with slurred speech.  Rule out      cerebrovascular accident.  Check MRI of the head, check ultrasound      of bilateral carotids as well as check 2-D echocardiogram.  The      patient is to be followed by neurology, Dr. Roseanne Reno.  Continue all      home medications at this time.  Start patient on aspirin 325 mg      p.o. q.24 hours.  2. History of hypothyroidism.  Check TSH and continue Synthroid.  3. History of atrial fibrillation/complete heart block with permanent      pacemaker.  Continue supportive care.  4. History of dyslipidemia.  Continue Zocor, check fasting lipid      profile.  5. History of congestive heart failure, currently euvolemic state.      Continue her Lasix.  6. Gastrointestinal and deep  vein thrombosis prophylaxis:  Protonix      and sequential compression devices.      Manus Gunning, MD  Electronically Signed     SP/MEDQ  D:  01/15/2009  T:  01/15/2009  Job:  [id number]

## 2011-01-21 NOTE — Consult Note (Signed)
[name], [name]                ACCOUNT NO.:  [id number]   MEDICAL RECORD NO.:  [id number]          PATIENT TYPE:  INP   LOCATION:  3017                         FACILITY:  [facility]   PHYSICIAN:  Noel Christmas, MD    DATE OF BIRTH:  Feb 17, 1922   DATE OF CONSULTATION:  01/14/2009  DATE OF DISCHARGE:                                 CONSULTATION   REFERRING PHYSICIAN:  [facility]   REASON FOR CONSULTATION:  Change in mental status and slurred speech  presenting as code stroke.   HISTORY OF PRESENT ILLNESS:  This is an 77 year old lady, resident at a  local nursing home with a history of dementia, who was noticed by family  members who developed increasing slurring of speech as well as  increasing confusion.  Symptoms started at about 8:30 this evening.  She  presented as a code stroke about 8:55 p.m.  CT scan was available at  21:10.  Initial neurological examination was begun at 21:15.  No focal  abnormalities were noted on initial evaluation.  The patient was  confused, but conversant and speech was understandable, although  somewhat slurred.  CT scan of her head showed no acute abnormalities.  She complained of chronic pain involving the right lower extremity and  at one point had complained of a headache.  She was in no distress  otherwise.  Metabolic workup was unremarkable with no significant  abnormality found.  The patient was afebrile.  There was no indication  of an underlying infectious process.   PAST MEDICAL HISTORY:  Remarkable for chronic dementia, congestive heart  failure, hypothyroidism, atrial fibrillation, pacemaker placement for  complete heart block, uterine cancer with hysterectomy, dyslipidemia,  and GERD.   CURRENT MEDICATIONS:  1. Aspirin 81 mg per day.  2. Lasix 80 mg per day.  3. Nexium 40 mg per day.  4. Synthroid 50 mcg per day.  5. Zocor 40 mg per day.  6. Aricept 10 mg per day.  7. Fergon 28 mg twice a day.  8. Vicodin 5/500 twice a  day.  9. MiraLax 17 g p.r.n.  10.Potassium chloride 20 mEq per day.  11.Dialyvite once a day.   FAMILY HISTORY:  Noncontributory.   PHYSICAL EXAMINATION:  GENERAL:  Appearance was that of an elderly  African American lady of medium build who was alert and cooperative, and  in no acute distress except for pain when her right leg was moved.  She  was cooperative including with examination of strength of her right leg.  She was oriented x1.  She also had frequent somewhat myoclonic leg  movements at times, involving upper extremities primarily.  There was no  frank tremor.  The patient was edentulous.  Speech was slightly slurred,  but understandable fairly easily.  HEENT:  Her pupils were equal and reactive normally to light.  Extraocular movements were full and conjugate.  Visual fields were  intact and normal.  There was no facial numbness, no facial weakness.  Hearing was normal.  Speech was as described.  Motor exam showed normal  strength proximally and  distally in all 4 extremities with symmetrical  strength as well.  Muscle tone was flaccid throughout.  She has no  pronator drift on either side.  Deep tendon reflexes were normal and  symmetrical in upper extremities and absent in lower extremities.  Plantar responses were mute.  Sensory exam was normal.  Coordination was  normal.  Carotid auscultation was normal.   CLINICAL IMPRESSION:  1. Mental status change with apparent increase in confusion along with      worsening of baseline slurring of speech from being edentulous.      Etiology is unclear.  The patient has no signs of an acute stroke.      There are also no indications of an underlying infectious process      nor significant metabolic abnormality.  2. Chronic dementia of moderate severity.   RECOMMENDATIONS:  1. CT scan of the head with contrast since the patient will not be      able to undergo MRI because of pacemaker.  2. EEG in the a.m. to rule out possible new  onset seizure disorder.  3. No changes in current medications.   Thank you for asking me to evaluate Ms. Clanton.      Noel Christmas, MD  Electronically Signed     CS/MEDQ  D:  01/15/2009  T:  01/15/2009  Job:  [id number]

## 2011-01-21 NOTE — Discharge Summary (Signed)
[name], [name]                ACCOUNT NO.:  [id number]   MEDICAL RECORD NO.:  [id number]          PATIENT TYPE:  INP   LOCATION:  3017                         FACILITY:  [facility]   PHYSICIAN:  Michelene Gardener, MD    DATE OF BIRTH:  11/26/1921   DATE OF ADMISSION:  01/14/2009  DATE OF DISCHARGE:  01/18/2009                               DISCHARGE SUMMARY   DISCHARGE DIAGNOSIS:  1. Altered mental status that resolved.  2. Hypothyroidism.  3. History of congestive heart failure without acute exacerbation.  4. History of  permanent pacemaker secondary to complete heart block      without major events during this hospitalization.  5. History of atrial fibrillation.  The patient was not on chronic      anticoagulation because of fall risk.  6. Dyslipidemia.  7. Mild dementia.  8. Gastroesophageal reflux disease.   DISCHARGE MEDICATIONS:  1. Aspirin 81 mg p.o. once daily.  2. Lasix 80 mg once a day.  3. Nexium 40 mg once a day.  4. Synthroid 50 mcg once a day.  5. Zocor 40 mg once a day.  6. Aricept 10 mg once a day.  7. Fergon 28 mg twice daily.  8. MiraLax 17 mg once a day as needed.  9. Potassium chloride 20 mEq once a day.   CONSULTATIONS:  Neuro consult.   PROCEDURE:  None.   DIAGNOSTIC STUDIES:  1. EEG done on May 11 showed no acute findings.  2. CT scan of the head without contrast on May 9 showed no acute      findings.  3. Chest x-ray on May 9 showed borderline cardiomegaly without acute      findings.  Follow-up with primary doctor within 1-2 weeks.   COURSE OF HOSPITALIZATION.:  1. Altered mental status.  Etiology is unclear but most likely      secondary to acute delirium on top of her baseline dementia.  The      patient was admitted to the hospital for further evaluation.  CT      scan of the head was done and it came to be negative.Marland Kitchen  EEG was      done and it came to be negative.  Neurology consultation was done      during this hospitalization and  management has been mainly directed      by the neurology team.  The patient was cleared to discharge by      neurology.  She is almost back to her baseline.  2. Hypothyroidism.  TSH was done and it was within normal limits.      Synthroid was continued on the same dose.  3. History of atrial fibrillation with complete heart block.  The      patient had pacemaker in the past.  There are no major arrhythmias      monitored during this hospitalization.   Otherwise other medical conditions remained stable.  The patient will be  discharged today in stable condition.   Assessment time is 40 minutes.      Michelene Gardener, MD  Electronically Signed     NAE/MEDQ  D:  01/18/2009  T:  01/18/2009  Job:  [id number]

## 2011-01-24 NOTE — Discharge Summary (Signed)
[name], [name]                ACCOUNT NO.:  [id number]   MEDICAL RECORD NO.:  [id number]          PATIENT TYPE:  INP   LOCATION:  3037                         FACILITY:  [facility]   PHYSICIAN:  Mobolaji B. Corky Downs, M.D.DATE OF BIRTH:  05-04-1922   DATE OF ADMISSION:  04/09/2005  DATE OF DISCHARGE:                                 DISCHARGE SUMMARY   PRIMARY CARE PHYSICIAN:  Clyda Greener, M.D.   CONSULTS:  Neurology consult,  Melvyn Novas, M.D.   FINAL DIAGNOSES:  1.  Adverse reaction to Reglan.  2.  Marked dysphagia.  3.  Moderate right internal carotid artery stenosis (60-80%).   SECONDARY DIAGNOSES:  1.  Hypothyroidism.  2.  Gastroesophageal reflux disease.  3.  Dyslipidemia.  4.  Microcytosis.   PROCEDURES:  1.  The EEG showed normal EEG tracing in a weak state with some slowing      thought to be related to drowsy state.  2.  Carotid Dopplers showed 60-80% range of internal carotid stenosis.  High      velocities may be due to vessel tortuosity.  External carotid artery      stenosis.  Left showed no evidence of significant internal carotid      artery stenosis.  Both vertebral artery flow antegrade.  3.  The 2-D echocardiogram report is pending at the time of dictation.  4.  The CT scan of the brain showed no acute intracranial findings.  There      were age-related atrophy with poor attentuation within the subcortical      white matter likely reflecting the sequelae of chronic microvascular MIC      or vascular ischemia.  5.  Swallowing evaluation which revealed moderate oropharyngeal dysphagia      with aspirin of thin liquid via cup.  Recommendation includes aspiration      precautions with full supervision.  Medication can be given in pureed      diet.  The patient should eat only when awake and alert, seated upright      at 90 degrees, take small bites/sips, with chin tucks, follow solid with      liquid, and strict reflux precautions.   BRIEF HISTORY:  Please  refer to the admission H&P.  [name] is an 77-  year-old resident of _________ [facility] assisted-living facility.  She presented with a recent history of involuntary jerky movements, tremor  like, and accompanying dizziness;  however, the patient's history was not  obtained from the patient secondary to dementia.  She was then admitted for  evaluation.   INITIAL VITALS ON ADMISSION:  Temperature 98.5, pulse 74, respiratory rate  20, blood pressure 168/73, which later rechecked as 119/79.  She was  saturating at 100% on 2 L.   HOSPITAL COURSE:  Problem 1.  Involuntary jerky movements involving the  upper extremities bilaterally.  The patient's evaluation did not suggest  acute stroke.  Given the bilateral nature of these involuntary movements,  the differentials were seizures and adverse reaction to  Reglan.  She was  evaluated by neurology, Melvyn Novas, M.D.  She had an EEG which did not  show any epileptiform wave abnormality.  In the meantime, Reglan was  discontinued.  The patient's symptoms improved.  She was also evaluated for  stroke/TIA.  There was no abnormal reading on telemetry.  Carotid ultrasound  did show moderate stenosis of the right internal carotid.  The vessel was  also noted to be tortuous which might have contributed to the high velocity.  The patient is a poor candidate for any intervention.  The patient has a  pacemaker which precluded the use of MRI to further evaluate for CVA;  however, she did not have any neurological deficits which makes CVA very  unlikely.   Problem 2.  Dysphagia.  The patient was noted to have cough after eating.  Swallowing evaluation was done, and the result and recommendation is as  noted above.   Problem 3.  [name] was seen and evaluated by physical therapy, and it  was recommended that she will need more days of physical therapy.  This will  be recommended to continue at the assisted-living facility.   PHYSICAL  EXAMINATION AT DISCHARGE:  VITAL SIGNS:  The patient was  hemodynamically stable with a blood pressure of 114/71 and a pulse rate of  60.  She was comfortable.  LUNGS:  Clinically clear.  CARDIOVASCULAR:  S1 and S2 regular without murmur.  ABDOMEN:  Soft, nontender.  MUSCULOSKELETAL:  There was no appreciable abnormal involuntary movements.  CENTRAL NERVOUS SYSTEM:  No focal neurological deficits.   PERTINENT LABORATORY DATA:  Homocysteine level 9.84, normal.  Vitamin B12  587, normal.  Folate greater than 20.  Fasting lipid profile:  Cholesterol  184, triglyceride 89, HDL 64, LDL 102.  TSH 9.147.  Troponin 0.01.   DISCHARGE MEDICATIONS:  1.  Aspirin 325 mg p.o. daily.  2.  Synthroid 100 mcg p.o. daily.  (This was increased secondary to elevated      TSH).  3.  Nexium 40 mg p.o. daily.  4.  Multivitamin 1 p.o. daily.  5.  Glycolax powder 1 capful daily.  6.  Zocor 80 mg q.h.s.  7.  Artificial Tears each eye 1 drop p.r.n. t.i.d.  8.  Colace 100 mg p.o. q.h.s. p.r.n.  9.  Discontinue Reglan.   CONDITION ON DISCHARGE:  The patient was stable for transfer.  There was no  slurred speech   Echocardiogram report is pending at the time of this dictation.      Mobolaji B. Corky Downs, M.D.  Electronically Signed     MBB/MEDQ  D:  04/11/2005  T:  04/11/2005  Job:  [id number]   cc:   Melvyn Novas, M.D.  Fax: [phone]   Clyda Greener, M.D.

## 2011-01-24 NOTE — Procedures (Signed)
TECHNICAL DESCRIPTION:  The background activity shows 7-8 Hz rhythms with  higher amplitudes seen in the posterior head regions bilaterally. Drowsiness  was recorded through much of this EEG. Hyperventilation testing was not  performed. Photic stimulation was performed which did not produce a  __________ response. No focal asymmetries or epileptiform activity was seen.   IMPRESSION:  This is a normal EEG during the awake state with some slowing  thought to be related to drowsy state.       EAV:WUJW  D:  04/10/2005 12:18:39  T:  04/10/2005 12:41:28  Job #:  [id number]

## 2011-01-24 NOTE — Assessment & Plan Note (Signed)
[facility]                         ELECTROPHYSIOLOGY OFFICE NOTE   EVANEE, LUBRANO                         MRN:          [id number]  DATE:09/22/2006                            DOB:          [birth year]    Ms. Yero returns today for followup. She is a very pleasant elderly  woman with a history of noncompliance who we saw most recently back in  July after a 2 year absence from the EP clinic. She has a history of  complete heart block status post pacemaker insertion and a history of  hypertension. She returns today for followup. I initially had hoped that  we had obtained a 2-D echo on her back in July as she had evidence of  some congestive heart failure although this has improved markedly. She  also has a history of murmur and this has persisted. She has had no  syncope. She has very minimal peripheral edema. She denies chest pain or  shortness of breath.   PHYSICAL EXAMINATION:  GENERAL:  She is a pleasant, elderly woman in no  distress.  VITAL SIGNS:  Blood pressure was 137/74, the pulse was 60 and regular,  respirations 18, the weight was 131 pounds, down 4 pounds from her most  recent visit.  LUNGS:  Clear bilaterally except for rales at the bases. There were no  wheezes or rhonchi. No increased work of breathing.  CARDIOVASCULAR:  Revealed a regular rate and rhythm with normal S1 and  S2. There was a grade 3/6 systolic murmur at the left lower sternal and  grade 2/6 systolic murmur at the right upper sternal border.  EXTREMITIES:  Demonstrate 1-2+ peripheral edema bilaterally.   Interrogation of her pacemaker demonstrates a Medtronic Sigma with P  waves of 2 and R waves which could not be obtained secondary to her  underlying pacemaker dependence. The impedance was 505 in the atrium,  528 in the right ventricle. The threshold was volt of 0.4 in both the  right atrium and the right ventricle. The battery voltage was 2.78  volts. She was  in A fib approximately 11% of the time.   IMPRESSION:  1. Hypertension.  2. Complete heart block.  3. Congestive heart failure thought secondary to diastolic      dysfunction.  4. Paroxysmal atrial fibrillation.   DISCUSSION:  Because of Ms. Pommier's noncompliance, I do not think she  is a Coumadin candidate and will continue her on aspirin. I will plan to  obtain a 2-D echo to see the significance  of her valvular heart disease. I also would like to follow up her LV  function as it had previously been normal though I suspect it has fallen  some.     Doylene Canning. Ladona Ridgel, MD  Electronically Signed    GWT/MedQ  DD: 09/22/2006  DT: 09/23/2006  Job #: [id number]   cc:   Clyda Greener, MD

## 2011-01-24 NOTE — H&P (Signed)
[name], [name]                ACCOUNT NO.:  [id number]   MEDICAL RECORD NO.:  [id number]          PATIENT TYPE:  EMS   LOCATION:  MAJO                         FACILITY:  [facility]   PHYSICIAN:  Isidor Holts, M.D.  DATE OF BIRTH:  09/28/1921   DATE OF ADMISSION:  04/09/2005  DATE OF DISCHARGE:                                HISTORY & PHYSICAL   PRIMARY CARE PHYSICIAN:  Dr. Clyda Greener. The patient is unassigned to [place].   CHIEF COMPLAINT:  Jerky movement, slurred speech, and weakness.   HISTORY OF PRESENT ILLNESS:  This is an 77 year old female, with known  history of mild dementia, dysthyroidism, dyslipidemia, resident of  [facility].  History is gleaned mainly from  ED M.D. and EMS records, as the patient is a poor historian.  Apparently at  about 3 p.m. on April 09, 2005, the patient started having involuntary  jerking movements, tremors and dizziness.  The specific circumstances are  not clear, however, I gather there was no loss of consciousness, no  shortness of breath, no chest pain.  The patient did complain of headache.  She was noted to have slurred speech, by EMS and also right-sided facial  droop with bilateral arm drift.  EMS brought the patient to the emergency  department.   PAST MEDICAL HISTORY:  1.  Hypothyroidism.  2.  Dyslipidemia.  3.  Mild dementia.  4.  GERD.  5.  Complete heart block; status post permanent pacer, August 05, 2002, by      Doylene Canning. Ladona Ridgel, M.D.  6.  Cardiac catheterization July of 2004 by Eduardo Osier. Harwani, M.D., LAD 10%      to 15%, RCA 30% to 40% ostial.  7.  Status post hysterectomy remotely for uterine cancer.  8.  Status post bilateral cataract surgery, right-sided 2002/2003, left-      sided April 10, 2003.   MEDICATIONS:  1.  Aspirin 81 mg p.o. daily.  2.  Nexium 40 mg p.o. daily.  3.  Multivitamin one p.o. daily.  4.  GlycoLax powder one capful daily.  5.  Synthroid 75 mcg p.o. daily.  6.   Zocor 80 mg p.o. q.h.s.  7.  Artificial tears each eye one drop p.r.n. q.i.d.  8.  Colace 100 mg p.o. q.h.s. p.r.n.  9.  Reglan 5 mg q.a.c. and 10 mg q.h.s. started April 08, 2005.   ALLERGIES:  No known drug allergies.   REVIEW OF SYSTEMS:  As per HPI and chief complaint, otherwise negative.   SOCIAL HISTORY:  The patient is a resident of [facility].  Nonsmoker, nondrinker, and has no history of drug abuse.   FAMILY HISTORY:  Not known at present.   PHYSICAL EXAMINATION:  VITAL SIGNS:  Temperature 98.5, pulse 71 per minute,  respiratory rate 20, blood pressure initially 168/73 mmHg, rechecked 119/79  mmHg, pulse oximetry 100% on 2 liters of oxygen.  GENERAL:  The patient is alert and communicative, although speech is  somewhat slurred and answers appropriately to questions although appears  forgetful.  Follows simple commands.  HEENT:  No clinical pallor.  No jaundice, no conjunctival injection. Throat  is quite clear.  NECK:  Supple.  JVP not seen.  No palpable lymphadenopathy.  No palpable  goiter.  No carotid bruits.  CHEST:  Clear to auscultation.  No wheezes or crackles.  Heart sounds 1 and  2 heard, normal, regular, no murmurs.  ABDOMEN:  Obese, old midline laparotomy scar is noted.  Soft and nontender.  There is no palpable organomegaly.  No palpable masses.  Normal bowel  sounds.  EXTREMITIES:  Lower extremity examination showed no adenopathy or edema.  Palpable peripheral pulses.  NEUROLOGY:  Alert with mildly slurred speech, no obvious tongue division or  feature suggestive of hypoglossal nerve palsy, no facial asymmetry.  Power  appears to be 4-/5 in upper extremities and at least 3+ to 4-/5 in both  lower extremities.  Plantars are downgoing bilaterally.  MUSCULOSKELETAL:  Changes of osteoarthritis.   LABORATORY DATA:  CBC; WBC 4.3, hemoglobin 13.1, hematocrit 38.8, MCV 100.7,  platelets 198.  INR is 1.1.  Electrolytes; sodium 137,  potassium 3.9,  chloride 106, CO2 24, BUN 13, creatinine 1.1, glucose 103.  AST 38, ALT 18,  alkaline phosphatase 79, urinalysis negative.   Chest x-ray dated April 09, 2005, shows no active cardiopulmonary disease.  There is mild cardiomegaly.  No pulmonary edema, mild diffuse peribronchial  thickening, dual lead intact pacer.   CT of head dated April 09, 2005; shows mild atrophy with chronic  microvascular white matter disease, no acute intracranial findings.   EKG showed paced complexes with a rate of 59 per minute.   ASSESSMENT:  1.  Involuntary movements, associated with slurred speech/arm weakness.      These phenomena were transient, the patient now has slurring of speech.      Unfortunately the patient's baseline is not known at this time.  There      appears to be no other focal neurologic deficits.  Certainly, the      patient had no tongue biting.  Differential diagnoses include possible      seizure versus TIA versus CVA versus a dystonic reaction secondary to      Reglan which was recently commenced.  I understand that Benadryl,      administered in the emergency department, had no appreciable effect.      Head CT scan showed no acute findings.  We shall admit the patient for      observation, arrange MRI/MRA and also carotid/vertebral Duplex scan,      EEG, two-dimensional echocardiogram.  Will maintain aspiration      precautions, keep the patient NPO for now except for medications, and      request speech pathology evaluation.  Meanwhile will increase the      patient's aspirin to 325 mg daily.   1.  Dysthyroidism.  Continue Synthroid in pre-admission dosages.  Check TSH.   1.  Gastroesophageal reflux disease.  Continue PPI.   1.  Dyslipidemia.  Will check fasting lipid profile, continue Zocor in pre-      admission dosages.   1.  Macrocytosis.  We shall check B12, folate, and homocysteine level.  Melvyn Novas, M.D., neurologist, has been called on consult by  emergency  department.  She will see the patient in due course.  Further management will depend on clinical course.       CO/MEDQ  D:  04/09/2005  T:  04/10/2005  Job:  [id number]

## 2011-01-24 NOTE — Consult Note (Signed)
[name], [name]                ACCOUNT NO.:  [id number]   MEDICAL RECORD NO.:  [id number]          PATIENT TYPE:  INP   LOCATION:  3037                         FACILITY:  [facility]   PHYSICIAN:  Melvyn Novas, M.D.  DATE OF BIRTH:  December 18, 1921   DATE OF CONSULTATION:  04/10/2005  DATE OF DISCHARGE:                                   CONSULTATION   REASON FOR CONSULTATION:  Mental status changes.   [name] an 77 year old right-handed African American female, resident of  a local nursing home, who was brought to the ER at [facility] with  fairly unspecific complaints. The ER physician called originally a code  stroke in response and cancelled it. I was asked to see the patient in the  emergency room and the consult was cancelled. I was informed that the  patient had no neurologic deficits. It appears that [name] had  involuntary movements that are not in any way more detailed described in her  nursing home and that Dr. Oletta Lamas found evidence that the patient had received  Reglan which is not a regular medication for this patient earlier the same  day. One of his thoughts was that the patient might have responded to Reglan  with confusion, dizziness, and jerking movement. The patient had a CT scan  obtained in the ER since she is a pacemaker patient and the CT scan without  contrast showed no abnormalities. Dr. Oletta Lamas was further informed that the  patient did not have slurred speech at baseline, but now in the ER seems to  have such and voices concern that she might be dehydrated or might have a  stroke too small to realize on a CT scan. The patient had remarkable normal  vital signs, heart rate, blood pressure, and showed no evidence of any  physiologic distress, shortness of breath, complained of no pain, and had no  history of fall. The ER physician named the primary care physician, Dr.  Parke Simmers, to be the patient's treating physician.   PAST MEDICAL HISTORY:  1.   Hypothyroidism.  2.  Hypercholesterolemia.  3.  Dementia.  4.  GERD.  5.  Heart blocks, grade 3, with pacemaker implantation.   PHYSICAL EXAMINATION:  VITAL SIGNS: 98.4 degrees Fahrenheit, 59 heart rate,  125/60 blood pressure, O2 saturation 99% on room air.  GENERAL: The patient wore oxygen via nasal cannula, but no oxygen was turned  on, so this was truly oxygenation on room air.  LUNGS: Clear to auscultation.  HEART: The patient has an irregular pulse and mild edema in to both feet. No  bruises. No cardiac murmur.  EXTREMITIES: She has no sign of abrasions or any fractures. Full range of  motion in all motor segments and follows simple motor commands promptly.  There is no neck rigidity.  MENTAL STATUS: The patient asked me first for a sip of water. After she  received that her speech was remarkably clear and she answered all my  questions. She also honestly answered that she has memory deficits. She  states that at her primary residence she receives  visits from her family,  some of the family members live in [place], but she was not able to move  in with the family member. She knows part of her medical history. She states  she does not smoke or drink and states she has had recently no changes in  her medication regimen that she knows of.  NEUROLOGIC: On cranial nerve examination, she has full extraocular  movements. Peripheral vision is intact with bilateral simultaneous  stimulation. There is a left ptosis, but no weakness of the lower face.  Tongue and uvula move midline and again the patient follows motor commands  promptly. She denies any facial sensory loss. Motor exam reveals equal grip  strength and 1+ upper deep tendon reflexes. There is, however, a discrepancy  in muscle tone. The left arm shows more rigor and passive elbow flexion and  extension shows some underlying spasticity. There is, however, no upgoing  toe to plantar stimulation and on the finger-to-nose test  there is  bilaterally mild dysmetria and on the left side mild tremor.  The patient is  not aware of any strokes or injuries as I ask her if she had weakness in the  left arm before. Sensory examination to touch, pinprick, and vibration is  according to the patient's similar. She can sit up in bed, is able to use a  water cup to drink. She state that she needs some times assistance to walk  because she is dizzy and she needs assistance to walk to the bathroom.   ASSESSMENT:  The patient had transient involuntary movements according to  ascertained information and a dizziness spell. I cannot see a persistent  neurologic deficit. Differential includes transient ischemic attack,  response to Reglan, or dehydration. At this time I would like to have for  the patient to have an EEG this morning and if that is negative I do not  think we need to follow any neurologic follow-up. She can return back to her  extended care facility. Please note that the patient's white blood cell  count was 4.3, H&H was rather high so she is likely slightly dehydrated.  Coagulation studies show an INR of 1.1 and protime of 13.9, both normal.  Chem-7 was normal. BUN actually was not elevated at 13. SGOT and SGPT were  normal. Cardiac markers were negative and urinalysis was negative. Again,  the patient had a CT of the brain that was read as negative yesterday.       CD/MEDQ  D:  04/10/2005  T:  04/10/2005  Job:  [id number]   cc:   Dr. Doneen Poisson   [facility] B Team

## 2011-03-10 ENCOUNTER — Encounter: Payer: Self-pay | Admitting: Podiatry

## 2011-03-18 ENCOUNTER — Encounter: Payer: Self-pay | Admitting: Internal Medicine

## 2011-09-25 ENCOUNTER — Encounter: Payer: Self-pay | Admitting: Internal Medicine

## 2011-10-21 ENCOUNTER — Encounter: Payer: Medicaid Other | Admitting: Internal Medicine

## 2011-10-21 ENCOUNTER — Telehealth: Payer: Self-pay | Admitting: Internal Medicine

## 2011-10-21 NOTE — Telephone Encounter (Signed)
09-25-11 sent past due letter/mt 10-21-11 pt made appt 11-06-11/mt

## 2011-10-30 ENCOUNTER — Encounter: Payer: Self-pay | Admitting: Internal Medicine

## 2011-11-06 ENCOUNTER — Ambulatory Visit (INDEPENDENT_AMBULATORY_CARE_PROVIDER_SITE_OTHER): Payer: MEDICARE | Admitting: Internal Medicine

## 2011-11-06 ENCOUNTER — Encounter: Payer: Self-pay | Admitting: Internal Medicine

## 2011-11-06 DIAGNOSIS — Z950 Presence of cardiac pacemaker: Secondary | ICD-10-CM

## 2011-11-06 DIAGNOSIS — I4891 Unspecified atrial fibrillation: Secondary | ICD-10-CM

## 2011-11-06 LAB — PACEMAKER DEVICE OBSERVATION
AL IMPEDENCE PM: 511 Ohm
ATRIAL PACING PM: 34.2
BAMS-0001: 150 {beats}/min
RV LEAD IMPEDENCE PM: 480 Ohm
RV LEAD THRESHOLD: 0.5 V

## 2011-11-06 NOTE — Progress Notes (Signed)
HPI [name] returns today for followup. She is a very pleasant elderly 78 year old woman with a history of symptomatic bradycardia, chronic atrial fibrillation, status post permanent pacemaker insertion. She denies chest pain. She denies shortness of breath. She has had no syncope. She has minimal peripheral edema. No Known Allergies   Current Outpatient Prescriptions  Medication Sig Dispense Refill  . acetaminophen (TYLENOL) 325 MG tablet Take 650 mg by mouth every 6 (six) hours as needed.      Marland Kitchen acetaminophen (TYLENOL) 500 MG tablet Take 500 mg by mouth 3 (three) times daily.      . Carboxymethylcellulose Sodium (REFRESH OP) Apply to eye.        . Cholecalciferol (VITAMIN D3) 1000 UNITS tablet Take 1,000 Units by mouth daily.        . Cranberry ([employer] CRANBERRY) 475 MG CAPS Take 2 capsules by mouth daily.       Marland Kitchen donepezil (ARICEPT) 5 MG tablet Take 5 mg by mouth daily.        Marland Kitchen esomeprazole (NEXIUM) 40 MG capsule Take 40 mg by mouth daily.        . Ferrous Gluconate (IRON) 246 (28 FE) MG TABS Take by mouth 2 (two) times daily.       . fluticasone (FLONASE) 50 MCG/ACT nasal spray Place into the nose as directed.        . hydrochlorothiazide (HYDRODIURIL) 25 MG tablet Take 25 mg by mouth daily.      . hydroxypropyl methylcellulose (ISOPTO TEARS) 2.5 % ophthalmic solution 1 drop as directed.        Marland Kitchen ketoconazole (NIZORAL) 2 % shampoo Apply topically once a week.      . levothyroxine (SYNTHROID) 50 MCG tablet Take 50 mcg by mouth daily.        . Multiple Vitamins-Minerals (MULTIVITAL) tablet Take 1 tablet by mouth daily.        Marland Kitchen Nystatin POWD Take by mouth 2 (two) times daily.      . NYSTATIN, TOPICAL, POWD Apply topically as needed.      . polyethylene glycol powder (GLYCOLAX/MIRALAX) powder Take 17 g by mouth daily.      . potassium chloride SA (K-DUR,KLOR-CON) 20 MEQ tablet Take 20 mEq by mouth daily.        . sertraline (ZOLOFT) 25 MG tablet Take 25 mg by mouth daily.      .  simvastatin (ZOCOR) 20 MG tablet Take 20 mg by mouth every evening.      . triamcinolone (KENALOG) 0.025 % cream Apply topically as directed.           Past Medical History  Diagnosis Date  . GERD (gastroesophageal reflux disease)   . Dyslipidemia   . Atrial fibrillation   . Thrombocytopenia   . Anemia, macrocytic   . H/O: hysterectomy   . Anemia, macrocytic   . Hypokalemia   . Hypothyroidism   . Delirium   . Dementia   . Uterine cancer   . CHF (congestive heart failure)   . Uterine cancer   . Complete heart block     ROS:   All systems reviewed and negative except as noted in the HPI.   Past Surgical History  Procedure Date  . Cataract extraction 2002, 2003    bilateral  . Cardiac catheterization 03/2003    Mohan N. Harwani  . Pacemaker insertion 2003     No family history on file.   History   Social History  . Marital  Status: Single    Spouse Name: N/A    Number of Children: N/A  . Years of Education: N/A   Occupational History  . Not on file.   Social History Main Topics  . Smoking status: Never Smoker   . Smokeless tobacco: Not on file  . Alcohol Use: No  . Drug Use: No  . Sexually Active: Not on file   Other Topics Concern  . Not on file   Social History Narrative  . No narrative on file     BP 116/70  Pulse 67  Wt 58.242 kg (128 lb 6.4 oz)  Physical Exam:  Well appearing elderly woman, NAD HEENT: Unremarkable Neck:  No JVD, no thyromegally Lungs:  Clear with no wheezes, rales, or rhonchi. HEART:  Regular rate rhythm, no murmurs, no rubs, no clicks Abd:  soft, positive bowel sounds, no organomegally, no rebound, no guarding Ext:  2 plus pulses, 2+ edema, no cyanosis, no clubbing Skin:  No rashes no nodules Neuro:  CN II through XII intact, motor grossly intact  DEVICE  Normal device function.  See PaceArt for details. Approaching elective replacement.  Assess/Plan:

## 2011-11-06 NOTE — Assessment & Plan Note (Signed)
Her device is working normally. She is approaching elective replacement. We'll see her back in several months.

## 2011-11-06 NOTE — Patient Instructions (Signed)
Your physician wants you to follow-up in: 6 months in the device clinic and 12 months with Dr Taylor You will receive a reminder letter in the mail two months in advance. If you don't receive a letter, please call our office to schedule the follow-up appointment.  

## 2011-11-06 NOTE — Assessment & Plan Note (Signed)
Her ventricular rate appears to be fairly well controlled. She will continue her current medical therapy.

## 2012-04-07 ENCOUNTER — Telehealth: Payer: Self-pay | Admitting: Internal Medicine

## 2012-04-07 NOTE — Telephone Encounter (Signed)
New msg [facility] called and wanted to know when she needs device ck again. Please call

## 2012-05-17 ENCOUNTER — Ambulatory Visit (INDEPENDENT_AMBULATORY_CARE_PROVIDER_SITE_OTHER): Payer: Medicaid Other | Admitting: *Deleted

## 2012-05-17 DIAGNOSIS — I442 Atrioventricular block, complete: Secondary | ICD-10-CM

## 2012-05-17 LAB — PACEMAKER DEVICE OBSERVATION
ATRIAL PACING PM: 36.2
BAMS-0001: 150 {beats}/min
BRDY-0002RV: 60 {beats}/min
RV LEAD IMPEDENCE PM: 472 Ohm

## 2012-05-17 NOTE — Progress Notes (Signed)
PPM check 

## 2012-05-26 ENCOUNTER — Emergency Department (HOSPITAL_COMMUNITY)
Admission: EM | Admit: 2012-05-26 | Discharge: 2012-05-27 | Disposition: A | Discharge status: Home / Self Care | Payer: Medicare Other | Attending: Emergency Medicine | Admitting: Emergency Medicine

## 2012-05-26 ENCOUNTER — Encounter (HOSPITAL_COMMUNITY): Payer: Self-pay | Admitting: Emergency Medicine

## 2012-05-26 ENCOUNTER — Emergency Department (HOSPITAL_COMMUNITY): Payer: Medicare Other

## 2012-05-26 DIAGNOSIS — F0390 Unspecified dementia without behavioral disturbance: Secondary | ICD-10-CM | POA: Insufficient documentation

## 2012-05-26 DIAGNOSIS — R109 Unspecified abdominal pain: Secondary | ICD-10-CM | POA: Insufficient documentation

## 2012-05-26 DIAGNOSIS — Z79899 Other long term (current) drug therapy: Secondary | ICD-10-CM | POA: Insufficient documentation

## 2012-05-26 DIAGNOSIS — J189 Pneumonia, unspecified organism: Secondary | ICD-10-CM | POA: Insufficient documentation

## 2012-05-26 DIAGNOSIS — Z950 Presence of cardiac pacemaker: Secondary | ICD-10-CM | POA: Insufficient documentation

## 2012-05-26 LAB — CBC WITH DIFFERENTIAL/PLATELET
HCT: 40.8 % (ref 36.0–46.0)
Hemoglobin: 13.8 g/dL (ref 12.0–15.0)
Lymphocytes Relative: 43 % (ref 12–46)
Lymphs Abs: 1.9 10*3/uL (ref 0.7–4.0)
Monocytes Absolute: 0.5 10*3/uL (ref 0.1–1.0)
Monocytes Relative: 12 % (ref 3–12)
Neutro Abs: 1.9 10*3/uL (ref 1.7–7.7)
RBC: 3.92 MIL/uL (ref 3.87–5.11)
WBC: 4.4 10*3/uL (ref 4.0–10.5)

## 2012-05-26 LAB — COMPREHENSIVE METABOLIC PANEL
Alkaline Phosphatase: 102 U/L (ref 39–117)
BUN: 13 mg/dL (ref 6–23)
CO2: 25 mEq/L (ref 19–32)
Chloride: 95 mEq/L — ABNORMAL LOW (ref 96–112)
Creatinine, Ser: 0.9 mg/dL (ref 0.50–1.10)
GFR calc non Af Amer: 55 mL/min — ABNORMAL LOW (ref >90)
Total Bilirubin: 0.7 mg/dL (ref 0.3–1.2)

## 2012-05-26 LAB — URINALYSIS, ROUTINE W REFLEX MICROSCOPIC
Ketones, ur: NEGATIVE mg/dL
Leukocytes, UA: NEGATIVE
Protein, ur: NEGATIVE mg/dL
Urobilinogen, UA: 1 mg/dL (ref 0.0–1.0)

## 2012-05-26 MED ORDER — IBUPROFEN 200 MG PO TABS
200.0000 mg | ORAL_TABLET | Freq: Once | ORAL | Status: AC
  Administered 2012-05-26: 200 mg via ORAL
  Filled 2012-05-26: qty 1

## 2012-05-26 MED ORDER — AZITHROMYCIN 250 MG PO TABS: ORAL_TABLET | ORAL | Status: DC

## 2012-05-26 MED ORDER — SODIUM CHLORIDE 0.9 % IV SOLN
INTRAVENOUS | Status: DC
  Administered 2012-05-26: 17:00:00 via INTRAVENOUS

## 2012-05-26 MED ORDER — AZITHROMYCIN 250 MG PO TABS
500.0000 mg | ORAL_TABLET | Freq: Once | ORAL | Status: AC
  Administered 2012-05-26: 500 mg via ORAL
  Filled 2012-05-26: qty 2

## 2012-05-26 NOTE — ED Notes (Signed)
Pt is a difficult stick, attempted 3x, unsuccessful.  IV rn notified.

## 2012-05-26 NOTE — ED Notes (Addendum)
[facility]- originally called out to [facility] with initial c/o tremors, however pt has hx of tremors.  Pt c/o abd pain and "just does'nt feel well." Pt also c/o h/a.  Pt has hx of dementia.  No active vomiting.

## 2012-05-26 NOTE — ED Notes (Signed)
[facility] called for transport back to [facility]. 

## 2012-05-26 NOTE — ED Provider Notes (Addendum)
History     CSN: [id number]  Arrival date & time 05/26/12  1500   First MD Initiated Contact with Patient 05/26/12 1524      Chief Complaint  Patient presents with  . Tremors    (Consider location/radiation/quality/duration/timing/severity/associated sxs/prior treatment) The history is provided by the patient. The history is limited by the condition of the patient.   patient here complaining of bilateral upper shoulder pain worse with movement. According to nursing home staff, she was originally then brought in for evaluation of tremors. However her tremors are chronic. There was some possibility of abdominal pain however patient denies at this time. She is alert and oriented x2 she denies any fever or diarrhea. Her upper shoulder pain is worse with movement and no acute injuries noted. She is a difficult historian secondary to her dementia  Past Medical History  Diagnosis Date  . GERD (gastroesophageal reflux disease)   . Dyslipidemia   . Atrial fibrillation   . Thrombocytopenia   . Anemia, macrocytic   . H/O: hysterectomy   . Anemia, macrocytic   . Hypokalemia   . Hypothyroidism   . Delirium   . Dementia   . Uterine cancer   . CHF (congestive heart failure)   . Uterine cancer   . Complete heart block     Past Surgical History  Procedure Date  . Cataract extraction 2002, 2003    bilateral  . Cardiac catheterization 03/2003    Mohan N. Harwani  . Pacemaker insertion 2003    No family history on file.  History  Substance Use Topics  . Smoking status: Never Smoker   . Smokeless tobacco: Not on file  . Alcohol Use: No    OB History    Grav Para Term Preterm Abortions TAB SAB Ect Mult Living                  Review of Systems  Unable to perform ROS   Allergies  Review of patient's allergies indicates no known allergies.  Home Medications   Current Outpatient Rx  Name Route Sig Dispense Refill  . ACETAMINOPHEN 325 MG PO TABS Oral Take 650 mg by mouth  every 6 (six) hours as needed.    . ACETAMINOPHEN 500 MG PO TABS Oral Take 500 mg by mouth 3 (three) times daily.    Marland Kitchen REFRESH OP Ophthalmic Apply to eye.      Marland Kitchen VITAMIN D3 1000 UNITS PO TABS Oral Take 1,000 Units by mouth daily.      Marland Kitchen CRANBERRY 475 MG PO CAPS Oral Take 2 capsules by mouth daily.     . DONEPEZIL HCL 5 MG PO TABS Oral Take 5 mg by mouth daily.      Marland Kitchen ESOMEPRAZOLE MAGNESIUM 40 MG PO CPDR Oral Take 40 mg by mouth daily.      . IRON 246 (28 FE) MG PO TABS Oral Take by mouth 2 (two) times daily.     Marland Kitchen FLUTICASONE PROPIONATE 50 MCG/ACT NA SUSP Nasal Place into the nose as directed.      . FUROSEMIDE 40 MG PO TABS Oral Take 40 mg by mouth daily.    Marland Kitchen HYDROCHLOROTHIAZIDE 25 MG PO TABS Oral Take 25 mg by mouth daily.    Marland Kitchen HYPROMELLOSE 2.5 % OP SOLN  1 drop as directed.      Marland Kitchen KETOCONAZOLE 2 % EX SHAM Topical Apply topically once a week.    Marland Kitchen LEVOTHYROXINE SODIUM 50 MCG PO TABS Oral Take  75 mcg by mouth daily.     . MULTIVITAL PO TABS Oral Take 1 tablet by mouth daily.      . NYSTATIN PO POWD Oral Take by mouth 2 (two) times daily.    Morton Stall EX POWD Apply externally Apply topically as needed.    Marland Kitchen POLYETHYLENE GLYCOL 3350 PO POWD Oral Take 17 g by mouth daily.    Marland Kitchen POTASSIUM CHLORIDE CRYS ER 20 MEQ PO TBCR Oral Take 20 mEq by mouth daily.      . SERTRALINE HCL 25 MG PO TABS Oral Take 25 mg by mouth daily.    Marland Kitchen SIMVASTATIN 20 MG PO TABS Oral Take 20 mg by mouth every evening.    Marland Kitchen TRAMADOL HCL 50 MG PO TABS Oral Take 25 mg by mouth 2 (two) times daily.    . TRIAMCINOLONE ACETONIDE 0.025 % EX CREA Topical Apply topically as directed.        BP 169/86  Pulse 63  Temp 97.9 F (36.6 C) (Oral)  Resp 22  SpO2 99%  Physical Exam  Nursing note and vitals reviewed. Constitutional: She appears well-developed and well-nourished.  Non-toxic appearance. No distress.  HENT:  Head: Normocephalic and atraumatic.  Eyes: Conjunctivae normal, EOM and lids are normal. Pupils are equal,  round, and reactive to light.  Neck: Normal range of motion. Neck supple. No tracheal deviation present. No mass present.  Cardiovascular: Normal rate, regular rhythm and normal heart sounds.  Exam reveals no gallop.   No murmur heard. Pulmonary/Chest: Effort normal and breath sounds normal. No stridor. No respiratory distress. She has no decreased breath sounds. She has no wheezes. She has no rhonchi. She has no rales.  Abdominal: Soft. Normal appearance and bowel sounds are normal. She exhibits no distension. There is no tenderness. There is no rigidity, no rebound, no guarding and no CVA tenderness.  Musculoskeletal: Normal range of motion. She exhibits no edema and no tenderness.  Neurological: She is alert. She has normal strength. No cranial nerve deficit or sensory deficit. GCS eye subscore is 4. GCS verbal subscore is 5. GCS motor subscore is 6.  Skin: Skin is warm and dry. No abrasion and no rash noted.  Psychiatric: Her behavior is normal. Her affect is blunt. Her speech is delayed.    ED Course  Procedures (including critical care time)   Labs Reviewed  CBC WITH DIFFERENTIAL  COMPREHENSIVE METABOLIC PANEL  URINALYSIS, ROUTINE W REFLEX MICROSCOPIC  URINE CULTURE   No results found.   No diagnosis found.    MDM  Pt had foley placed with good urine drainage--monitored here and with stable mental status, no signs of head trauma, follows commands--no indication for head ct--will d/c   cxr with possible pna, will treat     Toy Baker, MD 05/26/12 1610  Toy Baker, MD 05/26/12 2315

## 2012-05-26 NOTE — ED Notes (Signed)
Per telephone conversation with Alycia Rossetti from [facility], pt appeared to have tremors, more that usual.  Staff felt as if "something wasn't right" with resident.  Pt baseline mental status is confusion related to dementia.

## 2012-05-26 NOTE — ED Notes (Signed)
Staff was not able to get a urine sample thru the In and Out cath. RN notified

## 2012-05-28 LAB — URINE CULTURE: Culture: NO GROWTH

## 2012-06-22 ENCOUNTER — Encounter: Payer: Self-pay | Admitting: Internal Medicine

## 2012-07-14 ENCOUNTER — Encounter: Payer: Self-pay | Admitting: *Deleted

## 2012-07-19 ENCOUNTER — Ambulatory Visit (INDEPENDENT_AMBULATORY_CARE_PROVIDER_SITE_OTHER): Payer: MEDICARE | Admitting: *Deleted

## 2012-07-19 DIAGNOSIS — I442 Atrioventricular block, complete: Secondary | ICD-10-CM

## 2012-07-19 LAB — PACEMAKER DEVICE OBSERVATION
AL IMPEDENCE PM: 512 Ohm
BATTERY VOLTAGE: 2.7 V
BRDY-0003RV: 110 {beats}/min
BRDY-0004RV: 110 {beats}/min
RV LEAD THRESHOLD: 0.5 V
VENTRICULAR PACING PM: 99.5

## 2012-07-19 NOTE — Progress Notes (Signed)
PPM check 

## 2012-07-27 ENCOUNTER — Encounter: Payer: Self-pay | Admitting: *Deleted

## 2012-08-09 ENCOUNTER — Encounter: Payer: Self-pay | Admitting: Internal Medicine

## 2012-11-09 ENCOUNTER — Encounter: Payer: Self-pay | Admitting: Internal Medicine

## 2012-11-09 ENCOUNTER — Telehealth: Payer: Self-pay | Admitting: Internal Medicine

## 2012-11-09 ENCOUNTER — Ambulatory Visit (INDEPENDENT_AMBULATORY_CARE_PROVIDER_SITE_OTHER): Payer: MEDICARE | Admitting: Internal Medicine

## 2012-11-09 VITALS — BP 136/67 | HR 63 | Wt 110.4 lb

## 2012-11-09 DIAGNOSIS — I4891 Unspecified atrial fibrillation: Secondary | ICD-10-CM

## 2012-11-09 LAB — PACEMAKER DEVICE OBSERVATION
BRDY-0002RV: 60 {beats}/min
BRDY-0003RV: 110 {beats}/min
BRDY-0004RV: 110 {beats}/min
RV LEAD IMPEDENCE PM: 484 Ohm
RV LEAD THRESHOLD: 0.5 V

## 2012-11-09 NOTE — Telephone Encounter (Signed)
Left message at 418pm with pt's nursing home transportation coordinator [place] to call us to set up a one month pacer check with device, leave June appt/mt

## 2012-11-09 NOTE — Progress Notes (Signed)
HPI [name] returns today for followup. She is a very pleasant elderly woman with a history of symptomatic bradycardia, complete heart block, chronic atrial fibrillation, status post permanent pacemaker insertion. The patient has a history of dementia and is on Coumadin for this reason. She does have mild peripheral edema but denies shortness of breath. No Known Allergies   Current Outpatient Prescriptions  Medication Sig Dispense Refill  . acetaminophen (TYLENOL) 500 MG tablet Take 500 mg by mouth 3 (three) times daily.      . Carboxymethylcellulose Sodium (REFRESH OP) Apply to eye 4 (four) times daily.       . Cranberry ([employer] CRANBERRY) 475 MG CAPS Take 2 capsules by mouth daily.       Marland Kitchen donepezil (ARICEPT) 5 MG tablet Take 5 mg by mouth daily.       . Emollient (EUCERIN) lotion Apply topically as needed. Apply to feet and ankles and lower extremities      . esomeprazole (NEXIUM) 40 MG capsule Take 40 mg by mouth daily.       . Ferrous Gluconate (IRON) 246 (28 FE) MG TABS Take by mouth 2 (two) times daily.       . fluticasone (FLONASE) 50 MCG/ACT nasal spray Place 1 spray into the nose daily.       . furosemide (LASIX) 40 MG tablet Take 40 mg by mouth daily.      Marland Kitchen HYDROcodone-acetaminophen (NORCO/VICODIN) 5-325 MG per tablet Take 1 tablet by mouth every 6 (six) hours as needed.      . hydroxypropyl methylcellulose (ISOPTO TEARS) 2.5 % ophthalmic solution Place 1 drop into both eyes 4 (four) times daily.       Marland Kitchen ketoconazole (NIZORAL) 2 % shampoo Apply topically once a week.      . levothyroxine (SYNTHROID, LEVOTHROID) 75 MCG tablet Take 75 mcg by mouth daily.      . Multiple Vitamins-Minerals (MULTIVITAL) tablet Take 1 tablet by mouth daily.       Marland Kitchen Nystatin POWD Take by mouth 2 (two) times daily.      . NYSTATIN, TOPICAL, POWD Apply topically as needed.      . polyethylene glycol powder (GLYCOLAX/MIRALAX) powder Take 17 g by mouth daily.      . potassium chloride SA (K-DUR,KLOR-CON)  20 MEQ tablet Take 20 mEq by mouth daily.       . sertraline (ZOLOFT) 25 MG tablet Take 25 mg by mouth daily.      . simvastatin (ZOCOR) 20 MG tablet Take 20 mg by mouth every evening.      . traMADol (ULTRAM) 50 MG tablet Take 25 mg by mouth 2 (two) times daily.       No current facility-administered medications for this visit.     Past Medical History  Diagnosis Date  . GERD (gastroesophageal reflux disease)   . Dyslipidemia   . Atrial fibrillation   . Thrombocytopenia   . Anemia, macrocytic   . H/O: hysterectomy   . Anemia, macrocytic   . Hypokalemia   . Hypothyroidism   . Delirium   . Dementia   . Uterine cancer   . CHF (congestive heart failure)   . Uterine cancer   . Complete heart block     ROS:   All systems reviewed and negative except as noted in the HPI.   Past Surgical History  Procedure Laterality Date  . Cataract extraction  2002, 2003    bilateral  . Cardiac catheterization  03/2003  Eduardo Osier. Harwani  . Pacemaker insertion  2003     No family history on file.   History   Social History  . Marital Status: Single    Spouse Name: N/A    Number of Children: N/A  . Years of Education: N/A   Occupational History  . Not on file.   Social History Main Topics  . Smoking status: Never Smoker   . Smokeless tobacco: Not on file  . Alcohol Use: No  . Drug Use: No  . Sexually Active: Not on file   Other Topics Concern  . Not on file   Social History Narrative  . No narrative on file     BP 136/67  Pulse 63  Wt 110 lb 6.4 oz (50.077 kg)  Physical Exam:  Elderly, chronically ill appearing 79 year old woman, NAD HEENT: Unremarkable Neck:  7 cm JVD, no thyromegally Lungs:  Clear with no wheezes, rales, or rhonchi. HEART:  Regular rate rhythm, no murmurs, no rubs, no clicks Abd:  soft, positive bowel sounds, no organomegally, no rebound, no guarding Ext:  2 plus pulses, no edema, no cyanosis, no clubbing Skin:  No rashes no  nodules Neuro:  CN II through XII intact, motor grossly intact  EKG - atrial fibrillation with ventricular pacing  DEVICE  Normal device function.  See PaceArt for details.   Assess/Plan:

## 2012-11-09 NOTE — Patient Instructions (Addendum)
Your physician recommends that you schedule a follow-up appointment in: 3 months in device clinic and 12 months with Dr Taylor  

## 2012-12-20 ENCOUNTER — Other Ambulatory Visit: Payer: Self-pay | Admitting: Internal Medicine

## 2012-12-20 ENCOUNTER — Ambulatory Visit (INDEPENDENT_AMBULATORY_CARE_PROVIDER_SITE_OTHER): Payer: MEDICARE | Admitting: *Deleted

## 2012-12-20 DIAGNOSIS — I4891 Unspecified atrial fibrillation: Secondary | ICD-10-CM

## 2012-12-20 LAB — PACEMAKER DEVICE OBSERVATION
AL IMPEDENCE PM: 515 Ohm
BAMS-0001: 150 {beats}/min
BATTERY VOLTAGE: 2.66 V
RV LEAD IMPEDENCE PM: 494 Ohm

## 2012-12-20 NOTE — Progress Notes (Signed)
PPM battery check 

## 2013-01-06 ENCOUNTER — Encounter: Payer: Self-pay | Admitting: Internal Medicine

## 2013-01-09 ENCOUNTER — Encounter (HOSPITAL_COMMUNITY): Payer: Self-pay | Admitting: Emergency Medicine

## 2013-01-09 ENCOUNTER — Emergency Department (HOSPITAL_COMMUNITY)
Admission: EM | Admit: 2013-01-09 | Discharge: 2013-01-10 | Disposition: A | Discharge status: Home / Self Care | Payer: MEDICARE | Attending: Emergency Medicine | Admitting: Emergency Medicine

## 2013-01-09 ENCOUNTER — Emergency Department (HOSPITAL_COMMUNITY): Payer: MEDICARE

## 2013-01-09 DIAGNOSIS — E785 Hyperlipidemia, unspecified: Secondary | ICD-10-CM | POA: Insufficient documentation

## 2013-01-09 DIAGNOSIS — K219 Gastro-esophageal reflux disease without esophagitis: Secondary | ICD-10-CM | POA: Insufficient documentation

## 2013-01-09 DIAGNOSIS — E039 Hypothyroidism, unspecified: Secondary | ICD-10-CM | POA: Insufficient documentation

## 2013-01-09 DIAGNOSIS — Z8669 Personal history of other diseases of the nervous system and sense organs: Secondary | ICD-10-CM | POA: Insufficient documentation

## 2013-01-09 DIAGNOSIS — F0390 Unspecified dementia without behavioral disturbance: Secondary | ICD-10-CM | POA: Insufficient documentation

## 2013-01-09 DIAGNOSIS — Z79899 Other long term (current) drug therapy: Secondary | ICD-10-CM | POA: Insufficient documentation

## 2013-01-09 DIAGNOSIS — Z8679 Personal history of other diseases of the circulatory system: Secondary | ICD-10-CM | POA: Insufficient documentation

## 2013-01-09 DIAGNOSIS — I509 Heart failure, unspecified: Secondary | ICD-10-CM | POA: Insufficient documentation

## 2013-01-09 DIAGNOSIS — N390 Urinary tract infection, site not specified: Secondary | ICD-10-CM

## 2013-01-09 DIAGNOSIS — I4891 Unspecified atrial fibrillation: Secondary | ICD-10-CM | POA: Insufficient documentation

## 2013-01-09 DIAGNOSIS — Z862 Personal history of diseases of the blood and blood-forming organs and certain disorders involving the immune mechanism: Secondary | ICD-10-CM | POA: Insufficient documentation

## 2013-01-09 DIAGNOSIS — Z8542 Personal history of malignant neoplasm of other parts of uterus: Secondary | ICD-10-CM | POA: Insufficient documentation

## 2013-01-09 LAB — CBC WITH DIFFERENTIAL/PLATELET
Basophils Absolute: 0 10*3/uL (ref 0.0–0.1)
Basophils Relative: 0 % (ref 0–1)
HCT: 33.1 % — ABNORMAL LOW (ref 36.0–46.0)
Hemoglobin: 10.8 g/dL — ABNORMAL LOW (ref 12.0–15.0)
Lymphocytes Relative: 39 % (ref 12–46)
MCHC: 32.6 g/dL (ref 30.0–36.0)
Monocytes Relative: 7 % (ref 3–12)
Neutro Abs: 2.5 10*3/uL (ref 1.7–7.7)
Neutrophils Relative %: 54 % (ref 43–77)
RDW: 14.2 % (ref 11.5–15.5)
WBC: 4.6 10*3/uL (ref 4.0–10.5)

## 2013-01-09 LAB — OCCULT BLOOD, POC DEVICE: Fecal Occult Bld: NEGATIVE

## 2013-01-09 LAB — BASIC METABOLIC PANEL
CO2: 29 mEq/L (ref 19–32)
Chloride: 104 mEq/L (ref 96–112)
GFR calc Af Amer: 45 mL/min — ABNORMAL LOW (ref >90)
Potassium: 4.2 mEq/L (ref 3.5–5.1)

## 2013-01-09 LAB — LACTIC ACID, PLASMA: Lactic Acid, Venous: 0.9 mmol/L (ref 0.5–2.2)

## 2013-01-09 LAB — GLUCOSE, CAPILLARY: Glucose-Capillary: 117 mg/dL — ABNORMAL HIGH (ref 70–99)

## 2013-01-09 LAB — URINALYSIS, ROUTINE W REFLEX MICROSCOPIC
Bilirubin Urine: NEGATIVE
Ketones, ur: NEGATIVE mg/dL
Nitrite: NEGATIVE
Specific Gravity, Urine: 1.013 (ref 1.005–1.030)
Urobilinogen, UA: 1 mg/dL (ref 0.0–1.0)

## 2013-01-09 LAB — APTT: aPTT: 36 seconds (ref 24–37)

## 2013-01-09 LAB — URINE MICROSCOPIC-ADD ON

## 2013-01-09 MED ORDER — CIPROFLOXACIN IN D5W 400 MG/200ML IV SOLN
400.0000 mg | Freq: Once | INTRAVENOUS | Status: AC
  Administered 2013-01-10: 400 mg via INTRAVENOUS
  Filled 2013-01-09: qty 200

## 2013-01-09 NOTE — ED Notes (Signed)
Pt from [facility] c/o x 3 d. CBG = 123

## 2013-01-09 NOTE — ED Notes (Signed)
Bed:WA04<BR> Expected date:<BR> Expected time:<BR> Means of arrival:<BR> Comments:<BR>

## 2013-01-09 NOTE — ED Notes (Signed)
ZOX:[id number]<AV> Expected date:<BR> Expected time:<BR> Means of arrival:<BR> Comments:<BR> Res A

## 2013-01-09 NOTE — ED Provider Notes (Signed)
History     CSN: [id number]  Arrival date & time 01/09/13  1914   First MD Initiated Contact with Patient 01/09/13 2043      Chief Complaint  Patient presents with  . Headache    (Consider location/radiation/quality/duration/timing/severity/associated sxs/prior treatment) HPI  Patient is an 79 year old female brought in from [facility] via EMS for supposed headache. Pt is unable to provide history d/t dementia. [facility] was consulted and Monicka the [employer] was able to provide history stating after dinner patient had walking difficulty w/ jerk like movement. Patient is a level V caveat.   Past Medical History  Diagnosis Date  . GERD (gastroesophageal reflux disease)   . Dyslipidemia   . Atrial fibrillation   . Thrombocytopenia   . Anemia, macrocytic   . H/O: hysterectomy   . Anemia, macrocytic   . Hypokalemia   . Hypothyroidism   . Delirium   . Dementia   . Uterine cancer   . CHF (congestive heart failure)   . Uterine cancer   . Complete heart block     Past Surgical History  Procedure Laterality Date  . Cataract extraction  2002, 2003    bilateral  . Cardiac catheterization  03/2003    Mohan N. Harwani  . Pacemaker insertion  2003    No family history on file.  History  Substance Use Topics  . Smoking status: Never Smoker   . Smokeless tobacco: Not on file  . Alcohol Use: No    OB History   Grav Para Term Preterm Abortions TAB SAB Ect Mult Living                  Review of Systems  Unable to perform ROS: Dementia    Allergies  Review of patient's allergies indicates no known allergies.  Home Medications   Current Outpatient Rx  Name  Route  Sig  Dispense  Refill  . acetaminophen (TYLENOL) 500 MG tablet   Oral   Take 500 mg by mouth 3 (three) times daily.         . Cranberry ([employer] CRANBERRY) 475 MG CAPS   Oral   Take 2 capsules by mouth 2 (two) times daily.          Marland Kitchen donepezil (ARICEPT) 5 MG tablet   Oral   Take 5 mg  by mouth at bedtime.          . Emollient (EUCERIN) lotion   Topical   Apply 5 mLs topically every morning. Apply to feet and ankles and lower extremities         . esomeprazole (NEXIUM) 40 MG capsule   Oral   Take 40 mg by mouth every morning.          . Ferrous Gluconate (IRON) 246 (28 FE) MG TABS   Oral   Take by mouth 2 (two) times daily.          . fluticasone (FLONASE) 50 MCG/ACT nasal spray   Nasal   Place 1 spray into the nose every morning.          . furosemide (LASIX) 40 MG tablet   Oral   Take 40 mg by mouth every morning.          . hydroxypropyl methylcellulose (ISOPTO TEARS) 2.5 % ophthalmic solution   Both Eyes   Place 1 drop into both eyes 4 (four) times daily.          Marland Kitchen ketoconazole (NIZORAL) 2 %  shampoo   Topical   Apply 1 application topically once a week. On tuesdays         . levothyroxine (SYNTHROID, LEVOTHROID) 50 MCG tablet   Oral   Take 50 mcg by mouth daily before breakfast.         . polyethylene glycol powder (GLYCOLAX/MIRALAX) powder   Oral   Take 17 g by mouth every morning.          . potassium chloride SA (K-DUR,KLOR-CON) 20 MEQ tablet   Oral   Take 20 mEq by mouth every morning.          . sertraline (ZOLOFT) 25 MG tablet   Oral   Take 25 mg by mouth every morning.          . simvastatin (ZOCOR) 20 MG tablet   Oral   Take 20 mg by mouth every evening.         . traMADol (ULTRAM) 50 MG tablet   Oral   Take 25 mg by mouth 2 (two) times daily.         . ciprofloxacin (CIPRO) 500 MG tablet   Oral   Take 1 tablet (500 mg total) by mouth 2 (two) times daily.   20 tablet   0     BP 135/71  Pulse 61  Temp(Src) 98.2 F (36.8 C) (Oral)  Resp 17  SpO2 98%  Physical Exam  Constitutional: She appears well-developed and well-nourished. No distress.  HENT:  Head: Normocephalic and atraumatic.  Eyes: Conjunctivae are normal.  Neck: Neck supple.  Cardiovascular: Normal rate, regular rhythm and  normal heart sounds.   Pulmonary/Chest: Effort normal and breath sounds normal. No respiratory distress.  Abdominal: Soft. Bowel sounds are normal. There is no tenderness.  Neurological: She is alert.  Oriented to self. Able to follow commands w/ use of all four extremities.   Skin: Skin is warm and dry. She is not diaphoretic.    ED Course  Procedures (including critical care time)  Labs Reviewed  URINALYSIS, ROUTINE W REFLEX MICROSCOPIC - Abnormal; Notable for the following:    APPearance CLOUDY (*)    Hgb urine dipstick TRACE (*)    Protein, ur 30 (*)    Leukocytes, UA LARGE (*)    All other components within normal limits  CBC WITH DIFFERENTIAL - Abnormal; Notable for the following:    RBC 3.17 (*)    Hemoglobin 10.8 (*)    HCT 33.1 (*)    MCV 104.4 (*)    MCH 34.1 (*)    Platelets 127 (*)    All other components within normal limits  BASIC METABOLIC PANEL - Abnormal; Notable for the following:    Glucose, Bld 114 (*)    Creatinine, Ser 1.21 (*)    GFR calc non Af Amer 39 (*)    GFR calc Af Amer 45 (*)    All other components within normal limits  GLUCOSE, CAPILLARY - Abnormal; Notable for the following:    Glucose-Capillary 117 (*)    All other components within normal limits  URINE MICROSCOPIC-ADD ON - Abnormal; Notable for the following:    Squamous Epithelial / LPF MANY (*)    All other components within normal limits  URINE CULTURE  CULTURE, BLOOD (ROUTINE X 2)  CULTURE, BLOOD (ROUTINE X 2)  PROTIME-INR  APTT  LACTIC ACID, PLASMA  OCCULT BLOOD, POC DEVICE   Ct Head Wo Contrast  01/09/2013  *RADIOLOGY REPORT*  Clinical Data: 78 year old female headache  and confusion.  CT HEAD WITHOUT CONTRAST  Technique:  Contiguous axial images were obtained from the base of the skull through the vertex without contrast.  Comparison: 06/07/2010 and earlier.  Findings: Study is intermittently degraded by motion artifact despite repeated imaging attempts.  Visualized paranasal  sinuses and mastoids are clear.  No acute osseous abnormality identified.  No acute orbit or scalp soft tissue findings.  Chronic confluent basal ganglia dystrophic calcifications.  Chronic prominent linear calcification associated with the right foramen of Luschka.  Stable cerebral volume.  No ventriculomegaly. No midline shift, mass effect, or evidence of mass lesion.  No evidence of cortically based acute infarction identified.  No acute intracranial hemorrhage identified.  No suspicious intracranial vascular hyperdensity.  IMPRESSION: Stable noncontrast CT appearance of the brain allowing for motion artifact. No acute intracranial abnormality.     Original Report Authenticated By: Erskine Speed, M.D.      1. UTI (urinary tract infection)       MDM  Pt has been diagnosed with a UTI. Pt is afebrile, no CVA tenderness and normotensive. Rest of lab results and imaging wnl. Pt to be dc home with antibiotics after receiving first dose of Clindamycin IV and instructions to follow up with PCP if symptoms persist. Patient d/w with Dr. Radford Pax, agrees with plan.           Jeannetta Ellis, PA-C 01/11/13 1305

## 2013-01-09 NOTE — ED Notes (Signed)
MD at bedside.  EDP [place] at be evaluation this pt. Pt able to follow simple commands

## 2013-01-10 MED ORDER — CIPROFLOXACIN HCL 500 MG PO TABS: 500.0000 mg | ORAL_TABLET | Freq: Two times a day (BID) | ORAL | Status: DC

## 2013-01-10 MED ORDER — SODIUM CHLORIDE 0.9 % IV BOLUS (SEPSIS)
1000.0000 mL | Freq: Once | INTRAVENOUS | Status: AC
  Administered 2013-01-10: 1000 mL via INTRAVENOUS

## 2013-01-10 NOTE — ED Provider Notes (Signed)
Alert. Denies any anywhere at present. Appears comfortable  Doug Sou, MD 01/10/13 [phone]

## 2013-01-11 LAB — URINE CULTURE: Culture: NO GROWTH

## 2013-01-12 NOTE — ED Provider Notes (Signed)
Medical screening examination/treatment/procedure(s) were conducted as a shared visit with non-physician practitioner(s) and myself.  I personally evaluated the patient during the encounter   Nelia Shi, MD 01/12/13 2308

## 2013-01-16 LAB — CULTURE, BLOOD (ROUTINE X 2): Culture: NO GROWTH

## 2013-01-17 ENCOUNTER — Ambulatory Visit (INDEPENDENT_AMBULATORY_CARE_PROVIDER_SITE_OTHER): Payer: MEDICARE | Admitting: *Deleted

## 2013-01-17 ENCOUNTER — Other Ambulatory Visit: Payer: Self-pay | Admitting: Internal Medicine

## 2013-01-17 DIAGNOSIS — I4891 Unspecified atrial fibrillation: Secondary | ICD-10-CM

## 2013-01-17 LAB — PACEMAKER DEVICE OBSERVATION
AL IMPEDENCE PM: 517 Ohm
BAMS-0001: 150 {beats}/min
RV LEAD IMPEDENCE PM: 496 Ohm

## 2013-01-17 NOTE — Progress Notes (Signed)
Pacemaker check in clinic for battery. ROV in 1 mth with Brooke.

## 2013-02-10 ENCOUNTER — Encounter: Payer: Self-pay | Admitting: Internal Medicine

## 2013-02-10 ENCOUNTER — Ambulatory Visit (INDEPENDENT_AMBULATORY_CARE_PROVIDER_SITE_OTHER): Payer: MEDICARE | Admitting: Cardiology

## 2013-02-10 ENCOUNTER — Encounter: Payer: Self-pay | Admitting: Cardiology

## 2013-02-10 VITALS — Ht 59.0 in | Wt 110.0 lb

## 2013-02-10 DIAGNOSIS — Z950 Presence of cardiac pacemaker: Secondary | ICD-10-CM

## 2013-02-10 DIAGNOSIS — I442 Atrioventricular block, complete: Secondary | ICD-10-CM

## 2013-02-10 DIAGNOSIS — R0989 Other specified symptoms and signs involving the circulatory and respiratory systems: Secondary | ICD-10-CM

## 2013-02-10 DIAGNOSIS — I4891 Unspecified atrial fibrillation: Secondary | ICD-10-CM

## 2013-02-10 DIAGNOSIS — Z45010 Encounter for checking and testing of cardiac pacemaker pulse generator [battery]: Secondary | ICD-10-CM

## 2013-02-10 LAB — PACEMAKER DEVICE OBSERVATION: ATRIAL PACING PM: 36.4

## 2013-02-10 NOTE — Patient Instructions (Addendum)
Your physician recommends that you schedule a follow-up appointment in: 1 month with [name] , Scheduled appointment 03/17/2013 @ 2:00 pm.  Your physician recommends that you continue on your current medications as directed. Please refer to the Current Medication list given to you today.

## 2013-02-10 NOTE — Progress Notes (Signed)
PPM battery check in clinic. Nearing ERI. Battery voltage 2.63. Estimated longevity 3 months (<1 month to 10 months). Continue monthly battery checks in device clinic. ROV 03/17/2013.

## 2013-03-15 ENCOUNTER — Encounter: Payer: MEDICARE | Admitting: Cardiology

## 2013-03-17 ENCOUNTER — Encounter: Payer: MEDICARE | Admitting: Cardiology

## 2013-03-22 ENCOUNTER — Encounter: Payer: MEDICARE | Admitting: Cardiology

## 2013-03-23 ENCOUNTER — Encounter: Payer: Self-pay | Admitting: Internal Medicine

## 2013-04-12 ENCOUNTER — Ambulatory Visit (INDEPENDENT_AMBULATORY_CARE_PROVIDER_SITE_OTHER): Payer: MEDICARE | Admitting: Cardiology

## 2013-04-12 ENCOUNTER — Encounter: Payer: Self-pay | Admitting: *Deleted

## 2013-04-12 ENCOUNTER — Encounter: Payer: Self-pay | Admitting: Cardiology

## 2013-04-12 VITALS — BP 97/64 | HR 64 | Ht 59.0 in | Wt 119.4 lb

## 2013-04-12 DIAGNOSIS — I4891 Unspecified atrial fibrillation: Secondary | ICD-10-CM

## 2013-04-12 DIAGNOSIS — Z45018 Encounter for adjustment and management of other part of cardiac pacemaker: Secondary | ICD-10-CM

## 2013-04-12 DIAGNOSIS — I442 Atrioventricular block, complete: Secondary | ICD-10-CM

## 2013-04-12 DIAGNOSIS — Z45010 Encounter for checking and testing of cardiac pacemaker pulse generator [battery]: Secondary | ICD-10-CM

## 2013-04-12 DIAGNOSIS — Z950 Presence of cardiac pacemaker: Secondary | ICD-10-CM

## 2013-04-12 LAB — PACEMAKER DEVICE OBSERVATION

## 2013-04-12 LAB — CBC WITH DIFFERENTIAL/PLATELET
Basophils Absolute: 0 10*3/uL (ref 0.0–0.1)
Eosinophils Relative: 0.3 % (ref 0.0–5.0)
HCT: 40 % (ref 36.0–46.0)
Hemoglobin: 13.4 g/dL (ref 12.0–15.0)
Lymphs Abs: 1.8 10*3/uL (ref 0.7–4.0)
MCV: 105.5 fl — ABNORMAL HIGH (ref 78.0–100.0)
Monocytes Absolute: 0.3 10*3/uL (ref 0.1–1.0)
Monocytes Relative: 8.4 % (ref 3.0–12.0)
Neutro Abs: 1.8 10*3/uL (ref 1.4–7.7)
Platelets: 167 10*3/uL (ref 150.0–400.0)
RDW: 15 % — ABNORMAL HIGH (ref 11.5–14.6)

## 2013-04-12 LAB — BASIC METABOLIC PANEL
BUN: 17 mg/dL (ref 6–23)
Chloride: 101 mEq/L (ref 96–112)
GFR: 54.94 mL/min — ABNORMAL LOW (ref >60.00)
Glucose, Bld: 99 mg/dL (ref 70–99)
Potassium: 4.2 mEq/L (ref 3.5–5.1)
Sodium: 138 mEq/L (ref 135–145)

## 2013-04-12 NOTE — Patient Instructions (Addendum)
Your physician has recommended that you have a pacemaker Gen battery change. A pacemaker is a small device that is placed under the skin of your chest or abdomen to help control abnormal heart rhythms. This device uses electrical pulses to prompt the heart to beat at a normal rate. Pacemakers are used to treat heart rhythms that are too slow. Wire (leads) are attached to the pacemaker that goes into the chambers of you heart. This is done in the hospital and usually requires and overnight stay. Please see the instruction sheet given to you today for more information.  Your physician recommends that you return for lab work in: BMET/CBC TODAY  Your physician recommends that you continue on your current medications as directed. Please refer to the Current Medication list given to you today.  Your physician has recommended you make the following change in your medication:  MORNING OF PROCEDURE, DO NOT TAKE YOUR LASIX

## 2013-04-12 NOTE — Progress Notes (Signed)
ELECTROPHYSIOLOGY OFFICE NOTE  Patient ID: [name] MRN: [id number], DOB/AGE: [birth year]   Date of Visit: 04/12/2013  Primary Physician: Burtis Junes, MD Primary Cardiologist: Lewayne Bunting, MD Reason for Visit: EP/device follow-up  History of Present Illness  [name] is a 79 y.o. female with CHB s/p PPM implant, chronic AF and dementia who presents today for routine electrophysiology followup. She is accompanied by caregiver from [facility]. Her PPM battery is at [facility].   Since last being seen in our clinic, she reports she is doing "alright" and has no complaints. She denies chest pain or shortness of breath. She denies palpitations, dizziness, near syncope or syncope. She denies LE swelling, orthopnea, PND or recent weight gain.   Past Medical History Past Medical History  Diagnosis Date  . GERD (gastroesophageal reflux disease)   . Dyslipidemia   . Atrial fibrillation   . Thrombocytopenia   . Anemia, macrocytic   . H/O: hysterectomy   . Anemia, macrocytic   . Hypokalemia   . Hypothyroidism   . Delirium   . Dementia   . Uterine cancer   . CHF (congestive heart failure)   . Uterine cancer   . Complete heart block     Past Surgical History Past Surgical History  Procedure Laterality Date  . Cataract extraction  2002, 2003    bilateral  . Cardiac catheterization  03/2003    Mohan N. Harwani  . Pacemaker insertion  2003    Allergies/Intolerances No Known Allergies  Current Home Medications Current Outpatient Prescriptions  Medication Sig Dispense Refill  . acetaminophen (TYLENOL) 500 MG tablet Take 500 mg by mouth 3 (three) times daily.      . ciprofloxacin (CIPRO) 500 MG tablet Take 1 tablet (500 mg total) by mouth 2 (two) times daily.  20 tablet  0  . Cranberry ([employer] CRANBERRY) 475 MG CAPS Take 2 capsules by mouth 2 (two) times daily.       Marland Kitchen donepezil (ARICEPT) 5 MG tablet Take 5 mg by mouth at bedtime.       . Emollient (EUCERIN) lotion Apply  5 mLs topically every morning. Apply to feet and ankles and lower extremities      . esomeprazole (NEXIUM) 40 MG capsule Take 40 mg by mouth every morning.       . Ferrous Gluconate (IRON) 246 (28 FE) MG TABS Take by mouth 2 (two) times daily.       . fluticasone (FLONASE) 50 MCG/ACT nasal spray Place 1 spray into the nose every morning.       . furosemide (LASIX) 40 MG tablet Take 40 mg by mouth every morning.       . hydroxypropyl methylcellulose (ISOPTO TEARS) 2.5 % ophthalmic solution Place 1 drop into both eyes 4 (four) times daily.       Marland Kitchen ketoconazole (NIZORAL) 2 % shampoo Apply 1 application topically once a week. On tuesdays      . levothyroxine (SYNTHROID, LEVOTHROID) 50 MCG tablet Take 50 mcg by mouth daily before breakfast.      . nystatin (MYCOSTATIN) powder Apply 15 g topically 2 (two) times daily.      . polyethylene glycol powder (GLYCOLAX/MIRALAX) powder Take 17 g by mouth every morning.       . potassium chloride SA (K-DUR,KLOR-CON) 20 MEQ tablet Take 20 mEq by mouth every morning.       . sertraline (ZOLOFT) 25 MG tablet Take 25 mg by mouth every morning.       Marland Kitchen  simvastatin (ZOCOR) 20 MG tablet Take 20 mg by mouth every evening.      . traMADol (ULTRAM) 50 MG tablet Take 25 mg by mouth 2 (two) times daily.       No current facility-administered medications for this visit.   Social History Social History  . Marital Status: Single   Social History Main Topics  . Smoking status: Never Smoker   . Smokeless tobacco: No  . Alcohol Use: No  . Drug Use: No   Review of Systems General: No chills, fever, night sweats or weight changes Cardiovascular: No chest pain, dyspnea on exertion, edema, orthopnea, palpitations, paroxysmal nocturnal dyspnea Dermatological: No rash, lesions or masses Respiratory: No cough, dyspnea Urologic: No hematuria, dysuria Abdominal: No nausea, vomiting, diarrhea, bright red blood per rectum, melena, or hematemesis Neurologic: No visual changes,  weakness, changes in mental status All other systems reviewed and are otherwise negative except as noted above.  Physical Exam Vitals: Blood pressure 97/64, pulse 64, height 4\' 11"  (1.499 m), weight 119 lb 6.4 oz (54.159 kg).  General: Well developed, well appearing 79 y.o. female in no acute distress. HEENT: Normocephalic, atraumatic. EOMs intact. Sclera nonicteric. Oropharynx clear.  Neck: Supple. No JVD. Lungs: Respirations regular and unlabored, CTA bilaterally. No wheezes, rales or rhonchi. Heart: Regular. S1, S2 present. No murmurs, rub, S3 or S4. Abdomen: Soft, non-distended.  Extremities: No clubbing, cyanosis or edema.  Neuro: Alert. Moves all extremities spontaneously. Ambulates with walker.   Diagnostics Device interrogation limited today as battery is at [facility], voltage 2.60; V paced 100%  Assessment and Plan 1. CHB s/p PPM implant, now PPM battery at [facility] 2. Chronic AF 3. Dementia  [name] presents for EP/device follow-up. Her PPM battery is at [facility]. She is PPM dependent. Discussed the need for PPM generator change with her and her caregiver from [facility]. Reviewed the procedure involved, including risks and benefits. These risks include but are not limited to bleeding, infection and/or lead dislodgement / malfunction. [name] and her caregiver expressed verbal understanding of the procedure including risks and benefits and wish to proceed. Scheduled with Dr. Ladona Ridgel on Monday, 04/18/2013.  Signed, Rick Duff, PA-C 04/12/2013, 10:23 AM

## 2013-04-17 MED ORDER — SODIUM CHLORIDE 0.9 % IR SOLN
80.0000 mg | Status: DC
  Filled 2013-04-17: qty 2

## 2013-04-17 MED ORDER — CEFAZOLIN SODIUM-DEXTROSE 2-3 GM-% IV SOLR
2.0000 g | INTRAVENOUS | Status: DC
  Filled 2013-04-17: qty 50

## 2013-04-18 ENCOUNTER — Encounter (HOSPITAL_COMMUNITY)
Admission: RE | Disposition: A | Discharge status: Home / Self Care | Payer: Self-pay | Source: Ambulatory Visit | Attending: Internal Medicine

## 2013-04-18 ENCOUNTER — Ambulatory Visit (HOSPITAL_COMMUNITY)
Admission: RE | Admit: 2013-04-18 | Discharge: 2013-04-18 | Disposition: A | Discharge status: Home / Self Care | Payer: MEDICARE | Source: Ambulatory Visit | Attending: Internal Medicine | Admitting: Internal Medicine

## 2013-04-18 DIAGNOSIS — I442 Atrioventricular block, complete: Secondary | ICD-10-CM | POA: Insufficient documentation

## 2013-04-18 DIAGNOSIS — I4891 Unspecified atrial fibrillation: Secondary | ICD-10-CM | POA: Insufficient documentation

## 2013-04-18 DIAGNOSIS — Z45018 Encounter for adjustment and management of other part of cardiac pacemaker: Secondary | ICD-10-CM | POA: Diagnosis not present

## 2013-04-18 DIAGNOSIS — E785 Hyperlipidemia, unspecified: Secondary | ICD-10-CM | POA: Insufficient documentation

## 2013-04-18 DIAGNOSIS — Z8542 Personal history of malignant neoplasm of other parts of uterus: Secondary | ICD-10-CM | POA: Insufficient documentation

## 2013-04-18 DIAGNOSIS — Z79899 Other long term (current) drug therapy: Secondary | ICD-10-CM | POA: Insufficient documentation

## 2013-04-18 DIAGNOSIS — K219 Gastro-esophageal reflux disease without esophagitis: Secondary | ICD-10-CM | POA: Insufficient documentation

## 2013-04-18 DIAGNOSIS — E039 Hypothyroidism, unspecified: Secondary | ICD-10-CM | POA: Insufficient documentation

## 2013-04-18 DIAGNOSIS — D696 Thrombocytopenia, unspecified: Secondary | ICD-10-CM | POA: Insufficient documentation

## 2013-04-18 DIAGNOSIS — F0390 Unspecified dementia without behavioral disturbance: Secondary | ICD-10-CM | POA: Insufficient documentation

## 2013-04-18 DIAGNOSIS — I509 Heart failure, unspecified: Secondary | ICD-10-CM | POA: Diagnosis not present

## 2013-04-18 DIAGNOSIS — D539 Nutritional anemia, unspecified: Secondary | ICD-10-CM | POA: Insufficient documentation

## 2013-04-18 HISTORY — PX: PERMANENT PACEMAKER GENERATOR CHANGE: SHX6022

## 2013-04-18 LAB — SURGICAL PCR SCREEN: MRSA, PCR: POSITIVE — AB

## 2013-04-18 SURGERY — PERMANENT PACEMAKER GENERATOR CHANGE
Anesthesia: LOCAL

## 2013-04-18 MED ORDER — ACETAMINOPHEN 325 MG PO TABS
325.0000 mg | ORAL_TABLET | ORAL | Status: DC | PRN
  Filled 2013-04-18: qty 2

## 2013-04-18 MED ORDER — MUPIROCIN 2 % EX OINT
TOPICAL_OINTMENT | Freq: Two times a day (BID) | CUTANEOUS | Status: DC
  Filled 2013-04-18: qty 22

## 2013-04-18 MED ORDER — ONDANSETRON HCL 4 MG/2ML IJ SOLN: 4.0000 mg | Freq: Four times a day (QID) | INTRAMUSCULAR | Status: DC | PRN

## 2013-04-18 MED ORDER — MUPIROCIN 2 % EX OINT
TOPICAL_OINTMENT | CUTANEOUS | Status: AC
  Filled 2013-04-18: qty 22

## 2013-04-18 MED ORDER — LIDOCAINE HCL (PF) 1 % IJ SOLN
INTRAMUSCULAR | Status: AC
  Filled 2013-04-18: qty 60

## 2013-04-18 MED ORDER — SODIUM CHLORIDE 0.9 % IV SOLN: INTRAVENOUS | Status: DC

## 2013-04-18 MED ORDER — CHLORHEXIDINE GLUCONATE 4 % EX LIQD: 60.0000 mL | Freq: Once | CUTANEOUS | Status: DC

## 2013-04-18 NOTE — H&P (View-Only) (Signed)
 ELECTROPHYSIOLOGY OFFICE NOTE  Patient ID: [name] MRN: [id number], DOB/AGE: 77/01/1922   Date of Visit: 04/12/2013  Primary Physician: BLOUNT,ALVIN VINCENT, MD Primary Cardiologist: Gregg Taylor, MD Reason for Visit: EP/device follow-up  History of Present Illness  [name] is a 79 y.o. female with CHB s/p PPM implant, chronic AF and dementia who presents today for routine electrophysiology followup. She is accompanied by caregiver from [facility]. Her PPM battery is at [facility].   Since last being seen in our clinic, she reports she is doing "alright" and has no complaints. She denies chest pain or shortness of breath. She denies palpitations, dizziness, near syncope or syncope. She denies LE swelling, orthopnea, PND or recent weight gain.   Past Medical History Past Medical History  Diagnosis Date  . GERD (gastroesophageal reflux disease)   . Dyslipidemia   . Atrial fibrillation   . Thrombocytopenia   . Anemia, macrocytic   . H/O: hysterectomy   . Anemia, macrocytic   . Hypokalemia   . Hypothyroidism   . Delirium   . Dementia   . Uterine cancer   . CHF (congestive heart failure)   . Uterine cancer   . Complete heart block     Past Surgical History Past Surgical History  Procedure Laterality Date  . Cataract extraction  2002, 2003    bilateral  . Cardiac catheterization  03/2003    Mohan N. Harwani  . Pacemaker insertion  2003    Allergies/Intolerances No Known Allergies  Current Home Medications Current Outpatient Prescriptions  Medication Sig Dispense Refill  . acetaminophen (TYLENOL) 500 MG tablet Take 500 mg by mouth 3 (three) times daily.      . ciprofloxacin (CIPRO) 500 MG tablet Take 1 tablet (500 mg total) by mouth 2 (two) times daily.  20 tablet  0  . Cranberry ([employer]S CRANBERRY) 475 MG CAPS Take 2 capsules by mouth 2 (two) times daily.       . donepezil (ARICEPT) 5 MG tablet Take 5 mg by mouth at bedtime.       . Emollient (EUCERIN) lotion Apply  5 mLs topically every morning. Apply to feet and ankles and lower extremities      . esomeprazole (NEXIUM) 40 MG capsule Take 40 mg by mouth every morning.       . Ferrous Gluconate (IRON) 246 (28 FE) MG TABS Take by mouth 2 (two) times daily.       . fluticasone (FLONASE) 50 MCG/ACT nasal spray Place 1 spray into the nose every morning.       . furosemide (LASIX) 40 MG tablet Take 40 mg by mouth every morning.       . hydroxypropyl methylcellulose (ISOPTO TEARS) 2.5 % ophthalmic solution Place 1 drop into both eyes 4 (four) times daily.       . ketoconazole (NIZORAL) 2 % shampoo Apply 1 application topically once a week. On tuesdays      . levothyroxine (SYNTHROID, LEVOTHROID) 50 MCG tablet Take 50 mcg by mouth daily before breakfast.      . nystatin (MYCOSTATIN) powder Apply 15 g topically 2 (two) times daily.      . polyethylene glycol powder (GLYCOLAX/MIRALAX) powder Take 17 g by mouth every morning.       . potassium chloride SA (K-DUR,KLOR-CON) 20 MEQ tablet Take 20 mEq by mouth every morning.       . sertraline (ZOLOFT) 25 MG tablet Take 25 mg by mouth every morning.       .   simvastatin (ZOCOR) 20 MG tablet Take 20 mg by mouth every evening.      . traMADol (ULTRAM) 50 MG tablet Take 25 mg by mouth 2 (two) times daily.       No current facility-administered medications for this visit.   Social History Social History  . Marital Status: Single   Social History Main Topics  . Smoking status: Never Smoker   . Smokeless tobacco: No  . Alcohol Use: No  . Drug Use: No   Review of Systems General: No chills, fever, night sweats or weight changes Cardiovascular: No chest pain, dyspnea on exertion, edema, orthopnea, palpitations, paroxysmal nocturnal dyspnea Dermatological: No rash, lesions or masses Respiratory: No cough, dyspnea Urologic: No hematuria, dysuria Abdominal: No nausea, vomiting, diarrhea, bright red blood per rectum, melena, or hematemesis Neurologic: No visual changes,  weakness, changes in mental status All other systems reviewed and are otherwise negative except as noted above.  Physical Exam Vitals: Blood pressure 97/64, pulse 64, height 4' 11" (1.499 m), weight 119 lb 6.4 oz (54.159 kg).  General: Well developed, well appearing 79 y.o. female in no acute distress. HEENT: Normocephalic, atraumatic. EOMs intact. Sclera nonicteric. Oropharynx clear.  Neck: Supple. No JVD. Lungs: Respirations regular and unlabored, CTA bilaterally. No wheezes, rales or rhonchi. Heart: Regular. S1, S2 present. No murmurs, rub, S3 or S4. Abdomen: Soft, non-distended.  Extremities: No clubbing, cyanosis or edema.  Neuro: Alert. Moves all extremities spontaneously. Ambulates with walker.   Diagnostics Device interrogation limited today as battery is at [facility], voltage 2.60; V paced 100%  Assessment and Plan 1. CHB s/p PPM implant, now PPM battery at [facility] 2. Chronic AF 3. Dementia  [name] presents for EP/device follow-up. Her PPM battery is at [facility]. She is PPM dependent. Discussed the need for PPM generator change with her and her caregiver from [facility]. Reviewed the procedure involved, including risks and benefits. These risks include but are not limited to bleeding, infection and/or lead dislodgement / malfunction. [name] and her caregiver expressed verbal understanding of the procedure including risks and benefits and wish to proceed. Scheduled with Dr. Taylor on Monday, 04/18/2013.  Signed, Vallie Teters, PA-C 04/12/2013, 10:23 AM    

## 2013-04-18 NOTE — Interval H&P Note (Signed)
History and Physical Interval Note:  04/18/2013 11:48 AM  [name]  has presented today for surgery, with the diagnosis of eri  The various methods of treatment have been discussed with the patient and family. After consideration of risks, benefits and other options for treatment, the patient has consented to  Procedure(s): PERMANENT PACEMAKER GENERATOR CHANGE (N/A) as a surgical intervention .  The patient's history has been reviewed, patient examined, no change in status, stable for surgery.  I have reviewed the patient's chart and labs.  Questions were answered to the patient's satisfaction.     Leonia Reeves.D.

## 2013-04-18 NOTE — CV Procedure (Signed)
EP Procedure Note  Procedure: PPM removal and insertion of a new VVI PM  Indication: CHB, atrial fibrillation, s/p PPM at [facility]  Findings: After informed consent was obtained, the patient was taken to the diagnostic EP lab in the fasting state. After the usual preparation and draping, 30 cc of lidocaine was infiltrated into the left infraclavicular region. A 5 cm incision was carried out over this region, and electrocautery was utilized to dissect down to the pacemaker pocket. The pocket was incised, and the dense fibrous adhesions were freed up utilizing electrocautery. The pacemaker was removed with general traction. Atrial and ventricular leads were removed from the pacemaker and evaluated. The patient's underlying rhythm was atrial fibrillation with complete heart block. There were no R waves to measure. The pacing threshold was less than a volt at 0.5 ms in the ventricle. The impedence was 368 ohms. The Medtronic Sensia single-chamber pacemaker was connected to the old ventricular pacing lead and the device was placed in the subcutaneous pocket. The atrial lead was capped. The pocket was irrigated with antibiotic irrigation. The incision was closed with 2-0 and 3-0 Vicryl suture. Benzoin and Steri-Strips were placed on the skin, and the patient was returned to her room in satisfactory condition.  Complications: There were no immediate procedure complications  Conclusion: This demonstrate successful removal of a previous implanted dual-chamber pacemaker, and insertion of a new single-chamber pacemaker, in a patient with complete heart block and atrial fibrillation, whose old dual-chamber pacemaker had reached elective placement.  Lewayne Bunting, M.D.

## 2013-04-21 ENCOUNTER — Telehealth: Payer: Self-pay | Admitting: Internal Medicine

## 2013-04-21 NOTE — Telephone Encounter (Signed)
04-21-13 lmm @ 1112am for lindsay @ [employer] to call to set up 10 day apcer wound ck with device from implant date 04-18-13/mt

## 2013-04-26 ENCOUNTER — Telehealth: Payer: Self-pay | Admitting: Internal Medicine

## 2013-04-26 NOTE — Telephone Encounter (Signed)
04-21-13 lmm @ 1113am for lindsay with [facility] to set up 10 day pacer wound check with device clinic/mt

## 2013-05-24 ENCOUNTER — Telehealth: Payer: Self-pay | Admitting: Internal Medicine

## 2013-05-24 ENCOUNTER — Encounter: Payer: Self-pay | Admitting: Internal Medicine

## 2013-05-24 NOTE — Telephone Encounter (Signed)
04-21-13 lmm @ 1113am with lindsay [phone] at [facility] to set up 10 day pacer wound ck/mt 04-27-13 left another message at 1219pm/mt 05-24-13 sent letter to set up appt/mt

## 2013-06-01 ENCOUNTER — Telehealth: Payer: Self-pay | Admitting: Internal Medicine

## 2013-06-01 NOTE — Telephone Encounter (Signed)
04-21-13 lmm @ 1113a w/lindsay transportation for [employer] [phone]//mt 04-27-13 left another message 1209pm/mt 05-24-13 sent letter to make appt/mt

## 2013-07-18 ENCOUNTER — Emergency Department (HOSPITAL_COMMUNITY)
Admission: EM | Admit: 2013-07-18 | Discharge: 2013-07-18 | Disposition: A | Discharge status: Skilled Nursing Facility | Payer: MEDICARE | Attending: Emergency Medicine | Admitting: Emergency Medicine

## 2013-07-18 ENCOUNTER — Encounter (HOSPITAL_COMMUNITY): Payer: Self-pay | Admitting: Emergency Medicine

## 2013-07-18 DIAGNOSIS — Z8541 Personal history of malignant neoplasm of cervix uteri: Secondary | ICD-10-CM | POA: Insufficient documentation

## 2013-07-18 DIAGNOSIS — E039 Hypothyroidism, unspecified: Secondary | ICD-10-CM | POA: Insufficient documentation

## 2013-07-18 DIAGNOSIS — F0390 Unspecified dementia without behavioral disturbance: Secondary | ICD-10-CM | POA: Insufficient documentation

## 2013-07-18 DIAGNOSIS — Z79899 Other long term (current) drug therapy: Secondary | ICD-10-CM | POA: Insufficient documentation

## 2013-07-18 DIAGNOSIS — I509 Heart failure, unspecified: Secondary | ICD-10-CM | POA: Insufficient documentation

## 2013-07-18 DIAGNOSIS — D649 Anemia, unspecified: Secondary | ICD-10-CM | POA: Insufficient documentation

## 2013-07-18 DIAGNOSIS — I4891 Unspecified atrial fibrillation: Secondary | ICD-10-CM | POA: Insufficient documentation

## 2013-07-18 DIAGNOSIS — R251 Tremor, unspecified: Secondary | ICD-10-CM

## 2013-07-18 DIAGNOSIS — K219 Gastro-esophageal reflux disease without esophagitis: Secondary | ICD-10-CM | POA: Insufficient documentation

## 2013-07-18 DIAGNOSIS — E785 Hyperlipidemia, unspecified: Secondary | ICD-10-CM | POA: Insufficient documentation

## 2013-07-18 DIAGNOSIS — R259 Unspecified abnormal involuntary movements: Secondary | ICD-10-CM | POA: Insufficient documentation

## 2013-07-18 DIAGNOSIS — Z9861 Coronary angioplasty status: Secondary | ICD-10-CM | POA: Insufficient documentation

## 2013-07-18 LAB — CBC WITH DIFFERENTIAL/PLATELET
Basophils Absolute: 0 10*3/uL (ref 0.0–0.1)
Basophils Relative: 0 % (ref 0–1)
Eosinophils Absolute: 0 10*3/uL (ref 0.0–0.7)
Eosinophils Relative: 0 % (ref 0–5)
MCH: 35.6 pg — ABNORMAL HIGH (ref 26.0–34.0)
MCHC: 33.4 g/dL (ref 30.0–36.0)
MCV: 106.5 fL — ABNORMAL HIGH (ref 78.0–100.0)
Neutrophils Relative %: 50 % (ref 43–77)
Platelets: 171 10*3/uL (ref 150–400)
RBC: 3.68 MIL/uL — ABNORMAL LOW (ref 3.87–5.11)
RDW: 14.4 % (ref 11.5–15.5)

## 2013-07-18 LAB — BASIC METABOLIC PANEL
Calcium: 10.1 mg/dL (ref 8.4–10.5)
GFR calc Af Amer: 55 mL/min — ABNORMAL LOW (ref >90)
GFR calc non Af Amer: 47 mL/min — ABNORMAL LOW (ref >90)
Potassium: 4.4 mEq/L (ref 3.5–5.1)
Sodium: 139 mEq/L (ref 135–145)

## 2013-07-18 NOTE — ED Notes (Signed)
Patient had complaint of headache across forehead.

## 2013-07-18 NOTE — ED Provider Notes (Signed)
CSN: [id number]     Arrival date & time 07/18/13  1501 History   First MD Initiated Contact with Patient 07/18/13 1547     Chief Complaint  Patient presents with  . Tremors   (Consider location/radiation/quality/duration/timing/severity/associated sxs/prior Treatment) HPI Comments: Pt is a 79 y.o. female with Pmhx as above who presents with jerking mvmts.  Per staff at [facility], pt has had jerking mvmts like this for years, waxing/waning.  The staff member described then to look like chills, whole body.  She is able to hold conversation, has no LOC.  She had more frequency of mvmts today.  NO recent illness, injury, fall. Pt denies any symptoms currently.  Level 5 caveat for dementia  The history is provided by the patient and the EMS personnel. The history is limited by the condition of the patient. No language interpreter was used.    Past Medical History  Diagnosis Date  . GERD (gastroesophageal reflux disease)   . Dyslipidemia   . Atrial fibrillation   . Thrombocytopenia   . Anemia, macrocytic   . H/O: hysterectomy   . Anemia, macrocytic   . Hypokalemia   . Hypothyroidism   . Delirium   . Dementia   . Uterine cancer   . CHF (congestive heart failure)   . Uterine cancer   . Complete heart block    Past Surgical History  Procedure Laterality Date  . Cataract extraction  2002, 2003    bilateral  . Cardiac catheterization  03/2003    Mohan N. Harwani  . Pacemaker insertion  2003   History reviewed. No pertinent family history. History  Substance Use Topics  . Smoking status: Never Smoker   . Smokeless tobacco: Not on file  . Alcohol Use: No   OB History   Grav Para Term Preterm Abortions TAB SAB Ect Mult Living                 Review of Systems  Unable to perform ROS: Dementia  All other systems reviewed and are negative.    Allergies  Review of patient's allergies indicates no known allergies.  Home Medications   Current Outpatient Rx  Name  Route   Sig  Dispense  Refill  . acetaminophen (TYLENOL) 325 MG tablet   Oral   Take 325 mg by mouth every 8 (eight) hours.         Marland Kitchen atorvastatin (LIPITOR) 10 MG tablet   Oral   Take 10 mg by mouth at bedtime.         Marland Kitchen b complex vitamins capsule   Oral   Take 1 capsule by mouth daily.         . carboxymethylcellulose (REFRESH) 1 % ophthalmic solution   Ophthalmic   Apply 1 drop to eye 4 (four) times daily.         . Cranberry ([employer]S CRANBERRY) 475 MG CAPS   Oral   Take 1 capsule by mouth 2 (two) times daily.          Marland Kitchen donepezil (ARICEPT) 5 MG tablet   Oral   Take 5 mg by mouth at bedtime.          . Emollient (EUCERIN) lotion   Topical   Apply 5 mLs topically every morning. Apply to feet and ankles and lower extremities         . esomeprazole (NEXIUM) 40 MG capsule   Oral   Take 40 mg by mouth daily before breakfast.         .  Ferrous Gluconate (IRON) 246 (28 FE) MG TABS   Oral   Take 1 tablet by mouth 2 (two) times daily.          . fluticasone (FLONASE) 50 MCG/ACT nasal spray   Nasal   Place 1 spray into the nose every morning.          . furosemide (LASIX) 20 MG tablet   Oral   Take 30 mg by mouth daily. Take 1 and 1/2 tablet daily         . levothyroxine (SYNTHROID, LEVOTHROID) 50 MCG tablet   Oral   Take 50 mcg by mouth daily before breakfast.         . miconazole (MICOTIN) 2 % cream   Topical   Apply 1 application topically 2 (two) times daily.         Marland Kitchen nystatin (MYCOSTATIN) powder   Topical   Apply 15 g topically 2 (two) times daily.         . polyethylene glycol powder (GLYCOLAX/MIRALAX) powder   Oral   Take 17 g by mouth every morning.          . potassium chloride SA (K-DUR,KLOR-CON) 20 MEQ tablet   Oral   Take 20 mEq by mouth every morning.          . sertraline (ZOLOFT) 25 MG tablet   Oral   Take 25 mg by mouth every morning.          . traMADol (ULTRAM) 50 MG tablet   Oral   Take 25 mg by mouth 2 (two)  times daily.          BP 138/93  Pulse 64  Temp(Src) 97.8 F (36.6 C) (Oral)  Resp 18  SpO2 96% Physical Exam  Constitutional: She is oriented to person, place, and time. She appears well-developed and well-nourished. No distress.  HENT:  Head: Normocephalic and atraumatic.  Mouth/Throat: No oropharyngeal exudate.  Eyes: Pupils are equal, round, and reactive to light.  Neck: Normal range of motion. Neck supple.  Cardiovascular: Normal rate, regular rhythm and normal heart sounds.  Exam reveals no gallop and no friction rub.   No murmur heard. Pulmonary/Chest: Effort normal and breath sounds normal. No respiratory distress. She has no wheezes. She has no rales.  Abdominal: Soft. Bowel sounds are normal. She exhibits no distension and no mass. There is no tenderness. There is no rebound and no guarding.  Musculoskeletal: Normal range of motion. She exhibits no edema and no tenderness.  Neurological: She is alert and oriented to person, place, and time. She has normal strength. She displays tremor. No cranial nerve deficit or sensory deficit. Coordination normal. GCS eye subscore is 4. GCS verbal subscore is 4. GCS motor subscore is 6.  Had several seconds of tremor upon waking pt up  Skin: Skin is warm and dry.  Psychiatric: She has a normal mood and affect.    ED Course  Procedures (including critical care time) Labs Review Labs Reviewed  CBC WITH DIFFERENTIAL - Abnormal; Notable for the following:    WBC 3.3 (*)    RBC 3.68 (*)    MCV 106.5 (*)    MCH 35.6 (*)    All other components within normal limits  BASIC METABOLIC PANEL - Abnormal; Notable for the following:    Glucose, Bld 103 (*)    GFR calc non Af Amer 47 (*)    GFR calc Af Amer 55 (*)    All other components  within normal limits   Imaging Review No results found.  EKG Interpretation   None       MDM   1. Tremor    Pt is a 79 y.o. female with Pmhx as above who presents with jerking mvmts.  Per staff  at [facility], pt has had jerking mvmts like this for years, waxing/waning.  The staff member described then to look like chills, whole body.  She is able to hold conversation, has no LOC, no incontinence, no "marching" of mvmts.  She had more frequency of mvmts today.  NO recent illness, injury, fall.  On PE, VSS, pt in NAD.  I see no seizure-like activity, though pt did have a few seconds of tremor-like mvmt upon waking her.  Of note, complaint of "jerking mvmts" also reported in last ED visit on 5/4/'14 and had CT head w/o acute changes.  CBC, BMP unremarkable.  I do not feel these mvmts are likely to represent seizure-like activity, though pt would benefit from outpt neurology f/u for tremor.  Return precautions given for new or worsening symptoms including AMS, fever, numbness, weakness, fever, fall.         Shanna [facility], MD 07/19/13 [phone]

## 2013-07-18 NOTE — ED Notes (Signed)
No tremors noted upon assessment

## 2013-07-18 NOTE — ED Notes (Signed)
Pt from [facility]. EMS called for tremors. Pt has hx of "jerky movement" on nursing home PMH, but nursing home staff state pt's tremors are worse than usual to the point she cannot feed herself. EMS only noted one episode of tremors during transport. Pt denies any complaints.

## 2013-11-18 ENCOUNTER — Ambulatory Visit (INDEPENDENT_AMBULATORY_CARE_PROVIDER_SITE_OTHER): Payer: MEDICARE | Admitting: Internal Medicine

## 2013-11-18 ENCOUNTER — Encounter: Payer: Self-pay | Admitting: Internal Medicine

## 2013-11-18 VITALS — BP 113/66 | HR 89 | Ht 59.0 in | Wt 133.0 lb

## 2013-11-18 DIAGNOSIS — Z950 Presence of cardiac pacemaker: Secondary | ICD-10-CM

## 2013-11-18 DIAGNOSIS — I498 Other specified cardiac arrhythmias: Secondary | ICD-10-CM

## 2013-11-18 DIAGNOSIS — I4891 Unspecified atrial fibrillation: Secondary | ICD-10-CM

## 2013-11-18 DIAGNOSIS — R001 Bradycardia, unspecified: Secondary | ICD-10-CM

## 2013-11-18 LAB — MDC_IDC_ENUM_SESS_TYPE_INCLINIC
Battery Remaining Longevity: 114 mo
Brady Statistic RV Percent Paced: 100 %
Date Time Interrogation Session: 20150313135503
Lead Channel Impedance Value: 0 Ohm
Lead Channel Impedance Value: 490 Ohm
Lead Channel Pacing Threshold Amplitude: 0.5 V
Lead Channel Setting Sensing Sensitivity: 4 mV
MDC IDC MSMT BATTERY IMPEDANCE: 136 Ohm
MDC IDC MSMT BATTERY VOLTAGE: 2.79 V
MDC IDC MSMT LEADCHNL RV PACING THRESHOLD PULSEWIDTH: 0.4 ms
MDC IDC SET LEADCHNL RV PACING AMPLITUDE: 2.5 V
MDC IDC SET LEADCHNL RV PACING PULSEWIDTH: 0.4 ms

## 2013-11-18 NOTE — Assessment & Plan Note (Signed)
Her medtronic vvi pm is working normally. Will recheck in several months.

## 2013-11-18 NOTE — Progress Notes (Signed)
HPI [name] returns today for followup. She is a very pleasant elderly woman with a history of symptomatic bradycardia, complete heart block, chronic atrial fibrillation, status post permanent pacemaker insertion. The patient has a history of dementia and is not on Coumadin because of her dementia. She does have mild peripheral edema but denies shortness of breath. Her appetite is good. No Known Allergies   Current Outpatient Prescriptions  Medication Sig Dispense Refill  . acetaminophen (TYLENOL) 325 MG tablet Take 325 mg by mouth every 8 (eight) hours.      Marland Kitchen atorvastatin (LIPITOR) 10 MG tablet Take 10 mg by mouth at bedtime.      Marland Kitchen b complex vitamins capsule Take 1 capsule by mouth daily.      . carboxymethylcellulose (REFRESH) 1 % ophthalmic solution Apply 1 drop to eye 4 (four) times daily.      . Cranberry ([employer] CRANBERRY) 475 MG CAPS Take 1 capsule by mouth 2 (two) times daily.       Marland Kitchen donepezil (ARICEPT) 5 MG tablet Take 5 mg by mouth at bedtime.       . Emollient (EUCERIN) lotion Apply 5 mLs topically every morning. Apply to feet and ankles and lower extremities      . esomeprazole (NEXIUM) 40 MG capsule Take 40 mg by mouth daily before breakfast.      . Ferrous Gluconate (IRON) 246 (28 FE) MG TABS Take 1 tablet by mouth 2 (two) times daily.       . fluticasone (FLONASE) 50 MCG/ACT nasal spray Place 1 spray into the nose every morning.       . furosemide (LASIX) 20 MG tablet Take 30 mg by mouth daily. Take 1 and 1/2 tablet daily      . levothyroxine (SYNTHROID, LEVOTHROID) 50 MCG tablet Take 50 mcg by mouth daily before breakfast.      . miconazole (MICOTIN) 2 % cream Apply 1 application topically 2 (two) times daily.      Marland Kitchen nystatin (MYCOSTATIN) powder Apply 15 g topically 2 (two) times daily.      . polyethylene glycol powder (GLYCOLAX/MIRALAX) powder Take 17 g by mouth every morning.       . potassium chloride SA (K-DUR,KLOR-CON) 20 MEQ tablet Take 20 mEq by mouth every morning.        . sertraline (ZOLOFT) 25 MG tablet Take 25 mg by mouth every morning.       . traMADol (ULTRAM) 50 MG tablet Take 25 mg by mouth 2 (two) times daily.       No current facility-administered medications for this visit.     Past Medical History  Diagnosis Date  . GERD (gastroesophageal reflux disease)   . Dyslipidemia   . Atrial fibrillation   . Thrombocytopenia   . Anemia, macrocytic   . H/O: hysterectomy   . Anemia, macrocytic   . Hypokalemia   . Hypothyroidism   . Delirium   . Dementia   . Uterine cancer   . CHF (congestive heart failure)   . Uterine cancer   . Complete heart block     ROS:   All systems reviewed and negative except as noted in the HPI.   Past Surgical History  Procedure Laterality Date  . Cataract extraction  2002, 2003    bilateral  . Cardiac catheterization  03/2003    Mohan N. Harwani  . Pacemaker insertion  2003     No family history on file.   History   Social  History  . Marital Status: Single    Spouse Name: N/A    Number of Children: N/A  . Years of Education: N/A   Occupational History  . Not on file.   Social History Main Topics  . Smoking status: Never Smoker   . Smokeless tobacco: Not on file  . Alcohol Use: No  . Drug Use: No  . Sexual Activity: Not on file   Other Topics Concern  . Not on file   Social History Narrative  . No narrative on file     BP 113/66  Pulse 89  Ht 4\' 11"  (1.499 m)  Wt 133 lb (60.328 kg)  BMI 26.85 kg/m2  Physical Exam:  Elderly, chronically ill appearing 80 year old woman, NAD HEENT: Unremarkable Neck:  7 cm JVD, no thyromegally Lungs:  Clear with no wheezes, rales, or rhonchi. HEART:  Regular rate rhythm, no murmurs, no rubs, no clicks Abd:  soft, positive bowel sounds, no organomegally, no rebound, no guarding Ext:  2 plus pulses, no edema, no cyanosis, no clubbing Skin:  No rashes no nodules Neuro:  CN II through XII intact, motor grossly intact   DEVICE  Normal  device function.  See PaceArt for details.   Assess/Plan:

## 2013-11-18 NOTE — Assessment & Plan Note (Signed)
Her ventricular rate is well controlled as she is pacing almost 100% of the time. She is not a coumadin candidate with her history of dementia.

## 2013-11-18 NOTE — Patient Instructions (Signed)
Your physician recommends that you continue on your current medications as directed. Please refer to the Current Medication list given to you today.  Your physician wants you to follow-up in: 5 months with Dr. Lovena Le.  You will receive a reminder letter in the mail two months in advance. If you don't receive a letter, please call our office to schedule the follow-up appointment.

## 2014-05-02 ENCOUNTER — Encounter: Payer: Self-pay | Admitting: *Deleted

## 2014-06-13 ENCOUNTER — Encounter: Payer: Self-pay | Admitting: Internal Medicine

## 2014-06-13 ENCOUNTER — Ambulatory Visit (INDEPENDENT_AMBULATORY_CARE_PROVIDER_SITE_OTHER): Payer: MEDICARE | Admitting: Internal Medicine

## 2014-06-13 VITALS — BP 126/86 | HR 81 | Ht 59.0 in | Wt 131.4 lb

## 2014-06-13 DIAGNOSIS — I482 Chronic atrial fibrillation, unspecified: Secondary | ICD-10-CM

## 2014-06-13 DIAGNOSIS — Z950 Presence of cardiac pacemaker: Secondary | ICD-10-CM

## 2014-06-13 LAB — MDC_IDC_ENUM_SESS_TYPE_INCLINIC
Battery Impedance: 184 Ohm
Battery Voltage: 2.78 V
Brady Statistic RV Percent Paced: 100 %
Lead Channel Impedance Value: 0 Ohm
Lead Channel Pacing Threshold Amplitude: 0.5 V
Lead Channel Pacing Threshold Pulse Width: 0.4 ms
Lead Channel Setting Pacing Amplitude: 2.5 V
Lead Channel Setting Pacing Pulse Width: 0.4 ms
Lead Channel Setting Sensing Sensitivity: 4 mV
MDC IDC MSMT BATTERY REMAINING LONGEVITY: 99 mo
MDC IDC MSMT LEADCHNL RV IMPEDANCE VALUE: 447 Ohm
MDC IDC SESS DTM: 20151006121731

## 2014-06-13 NOTE — Patient Instructions (Signed)
Your physician wants you to follow-up in: 6 months with device clinic.  You will receive a reminder letter in the mail two months in advance. If you don't receive a letter, please call our office to schedule the follow-up appointment. Your physician wants you to follow-up in: 1 year with Dr. Lovena Le.   You will receive a reminder letter in the mail two months in advance. If you don't receive a letter, please call our office to schedule the follow-up appointment.

## 2014-06-13 NOTE — Progress Notes (Signed)
HPI [name] returns today for followup. She is a very pleasant elderly woman with a history of symptomatic bradycardia, complete heart block, chronic atrial fibrillation, status post permanent pacemaker insertion. The patient has a history of dementia and is not on Coumadin because of her dementia. She does have mild peripheral edema but denies shortness of breath. Her appetite is good. No Known Allergies   Current Outpatient Prescriptions  Medication Sig Dispense Refill  . acetaminophen (TYLENOL) 325 MG tablet Take 325 mg by mouth every 8 (eight) hours.      Marland Kitchen atorvastatin (LIPITOR) 10 MG tablet Take 10 mg by mouth at bedtime.      . carboxymethylcellulose (REFRESH) 1 % ophthalmic solution Apply 1 drop to eye 4 (four) times daily.      . Cranberry ([employer] CRANBERRY) 475 MG CAPS Take 1 capsule by mouth 2 (two) times daily.       Marland Kitchen donepezil (ARICEPT) 5 MG tablet Take 5 mg by mouth at bedtime.       . Emollient (EUCERIN) lotion Apply 5 mLs topically every morning. Apply to feet and ankles and lower extremities      . fluticasone (FLONASE) 50 MCG/ACT nasal spray Place 1 spray into the nose every morning.       . furosemide (LASIX) 20 MG tablet Take 20 mg by mouth daily.       Marland Kitchen ketoconazole (NIZORAL) 2 % shampoo Apply 1 application topically once a week.      . levothyroxine (SYNTHROID, LEVOTHROID) 50 MCG tablet Take 50 mcg by mouth daily before breakfast.      . nystatin (MYCOSTATIN) powder Apply 15 g topically 2 (two) times daily.      . polyethylene glycol powder (GLYCOLAX/MIRALAX) powder Take 17 g by mouth every morning.       . potassium chloride (MICRO-K) 10 MEQ CR capsule Take 1 capsule by mouth daily.      . sertraline (ZOLOFT) 25 MG tablet Take 25 mg by mouth every morning.       . traMADol (ULTRAM) 50 MG tablet Take 25 mg by mouth 2 (two) times daily.       No current facility-administered medications for this visit.     Past Medical History  Diagnosis Date  . GERD  (gastroesophageal reflux disease)   . Dyslipidemia   . Atrial fibrillation   . Thrombocytopenia   . Anemia, macrocytic   . H/O: hysterectomy   . Anemia, macrocytic   . Hypokalemia   . Hypothyroidism   . Delirium   . Dementia   . Uterine cancer   . CHF (congestive heart failure)   . Uterine cancer   . Complete heart block     ROS:   All systems reviewed and negative except as noted in the HPI.   Past Surgical History  Procedure Laterality Date  . Cataract extraction  2002, 2003    bilateral  . Cardiac catheterization  03/2003    Mohan N. Harwani  . Pacemaker insertion  2003     No family history on file.   History   Social History  . Marital Status: Single    Spouse Name: N/A    Number of Children: N/A  . Years of Education: N/A   Occupational History  . Not on file.   Social History Main Topics  . Smoking status: Never Smoker   . Smokeless tobacco: Not on file  . Alcohol Use: No  . Drug Use: No  . Sexual  Activity: Not on file   Other Topics Concern  . Not on file   Social History Narrative  . No narrative on file     BP 126/86  Pulse 81  Ht 4\' 11"  (1.499 m)  Wt 131 lb 6.4 oz (59.603 kg)  BMI 26.53 kg/m2  Physical Exam:  Elderly, chronically ill appearing 80 year old woman, NAD HEENT: Unremarkable Neck:  7 cm JVD, no thyromegally Lungs:  Clear with no wheezes, rales, or rhonchi. HEART:  Regular rate rhythm, no murmurs, no rubs, no clicks Abd:  soft, positive bowel sounds, no organomegally, no rebound, no guarding Ext:  2 plus pulses, no edema, no cyanosis, no clubbing Skin:  No rashes no nodules Neuro:  CN II through XII intact, motor grossly intact   DEVICE  Normal device function.  See PaceArt for details.   Assess/Plan:

## 2014-06-13 NOTE — Assessment & Plan Note (Signed)
Her Medtronic dual-chamber pacemaker is working normally. We will plan to recheck in several months.

## 2014-06-13 NOTE — Assessment & Plan Note (Signed)
Her ventricular rate is well controlled. She is not a candidate for systemic anticoagulation.

## 2014-06-14 ENCOUNTER — Encounter: Payer: Self-pay | Admitting: Internal Medicine

## 2014-08-17 ENCOUNTER — Encounter (HOSPITAL_COMMUNITY): Payer: Self-pay | Admitting: Internal Medicine

## 2014-12-09 IMAGING — CT CT HEAD W/O CM
4 series · 18 of 30 positions shown, 19 images · non-contrast
Comparison: 06/07/2010 and earlier.

CLINICAL DATA: 89-year-old female headache and confusion.

CT HEAD WITHOUT CONTRAST
TECHNIQUE: Contiguous axial images were obtained from the base of
the skull through the vertex without contrast.

[Series 2: head w/o · axial · non-contrast · 0.49mm/px · z∈[+1542,+1602]mm · 3 of 25 slices shown (1 of 2)]
[im 7/25  brain]
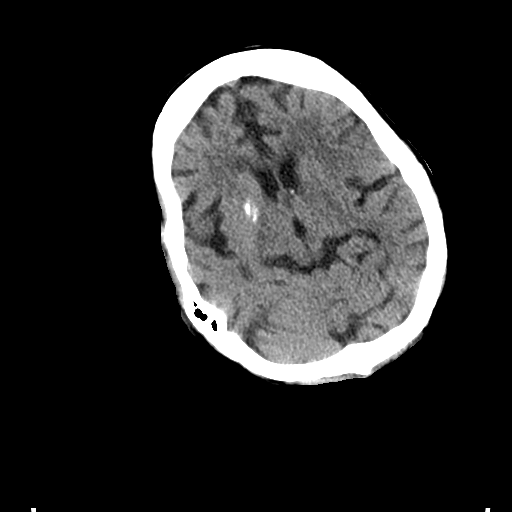
[im 13/25  brain]
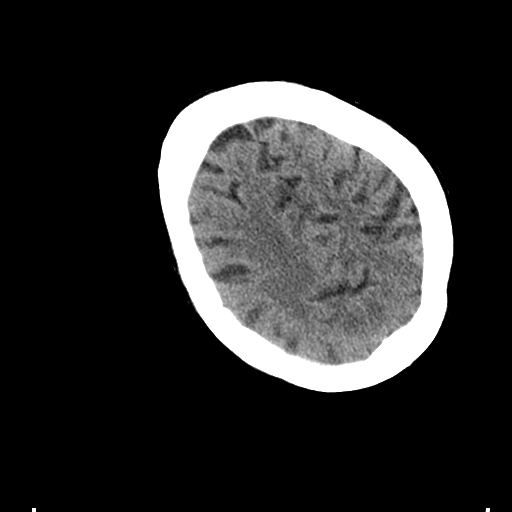
[im 19/25  brain]
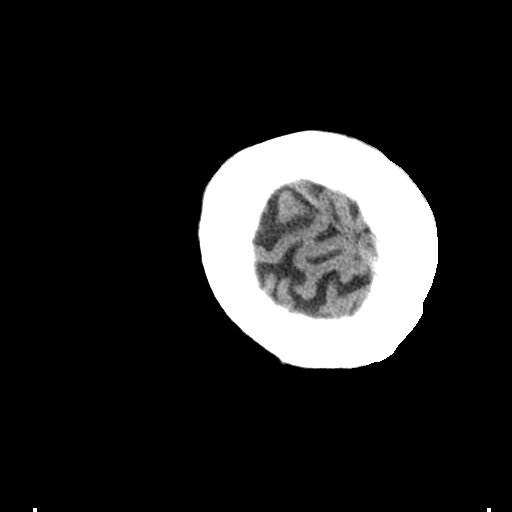

[Series 3: bone windows · axial · 0.49mm/px · z∈[+1527,+1557]mm · 3 of 41 slices shown (1 of 2)]
[im 6/41  bone]
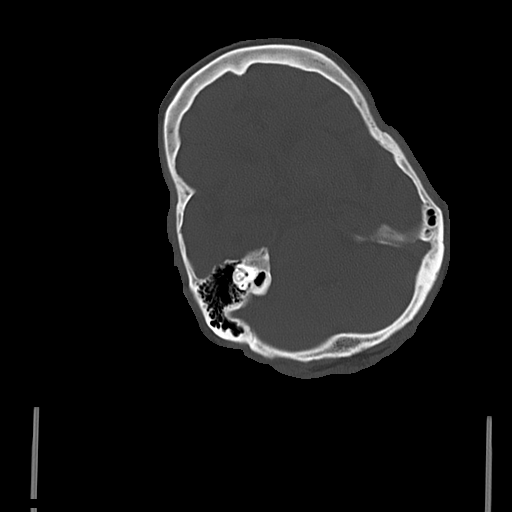
[im 11/41  bone]
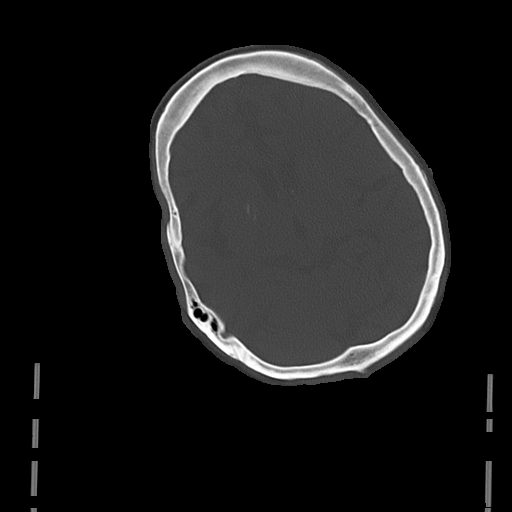
[im 16/41  bone]
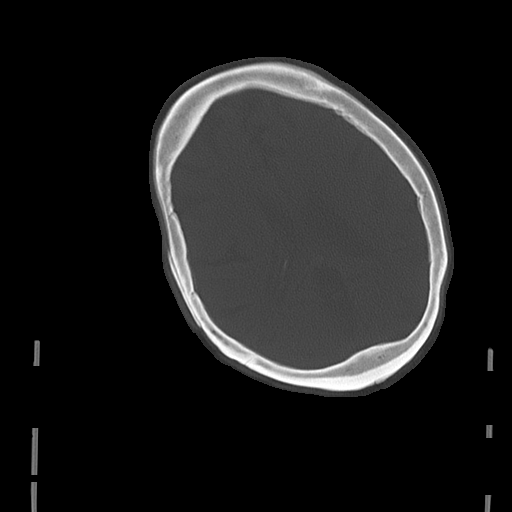

[Series 4: head w/o · axial · non-contrast · 0.49mm/px · z∈[+1509,+1584]mm · 4 of 27 slices shown, 5 images (2 of 2)]
[im 6/27  brain]
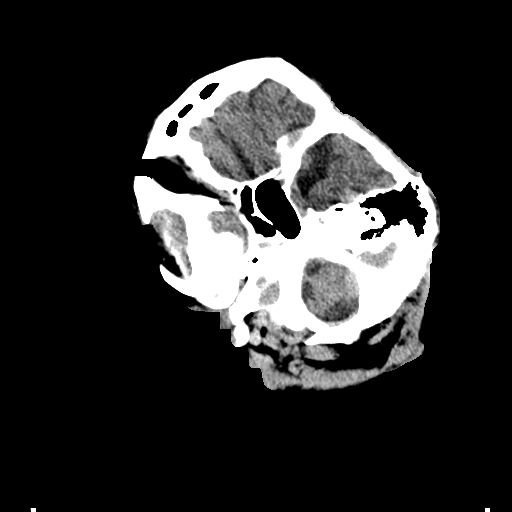
[im 6/27  bone]
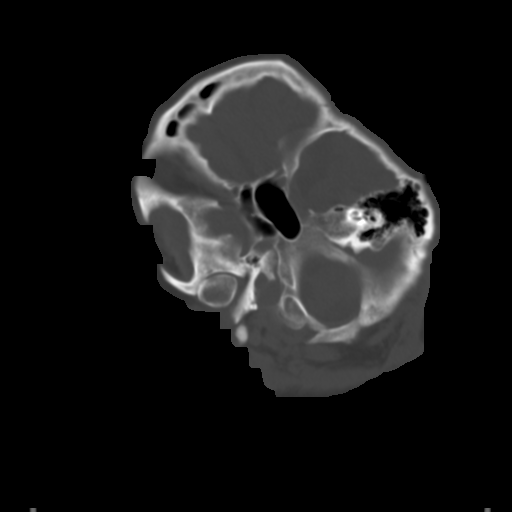
[im 11/27  brain]
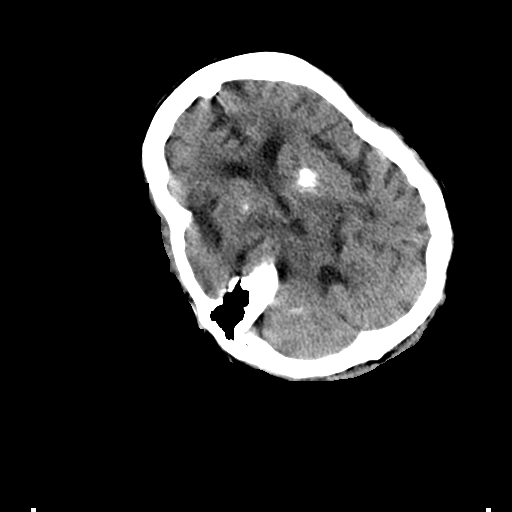
[im 16/27  brain]
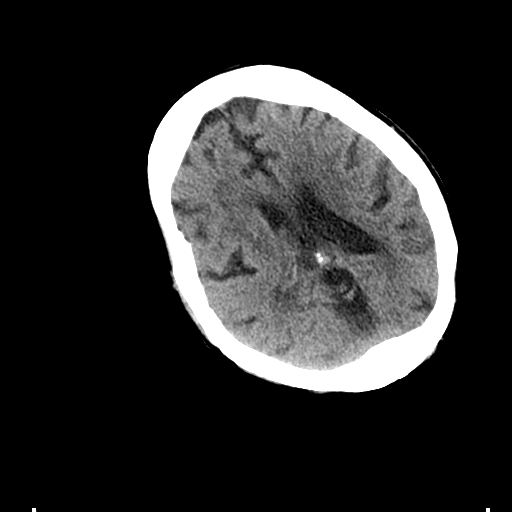
[im 21/27  brain]
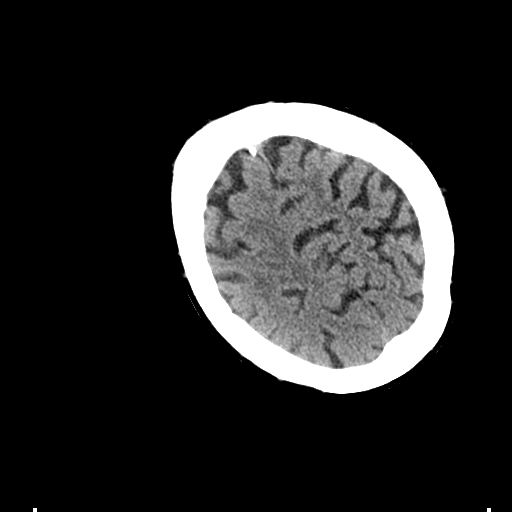

[Series 5: bone windows · axial · 0.49mm/px · z∈[+1496,+1598]mm · 8 of 44 slices shown (2 of 2)]
[im 5/44  bone]
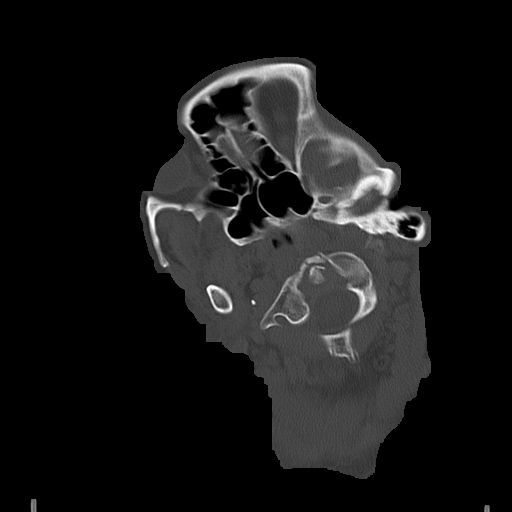
[im 10/44  bone]
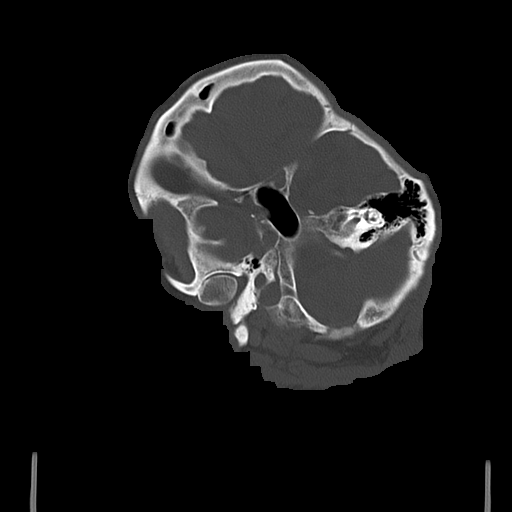
[im 15/44  bone]
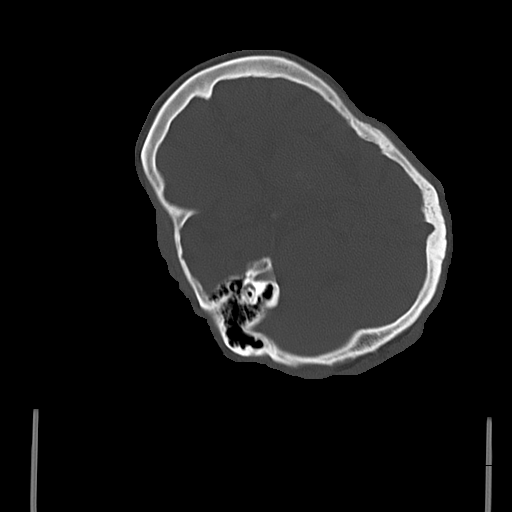
[im 20/44  bone]
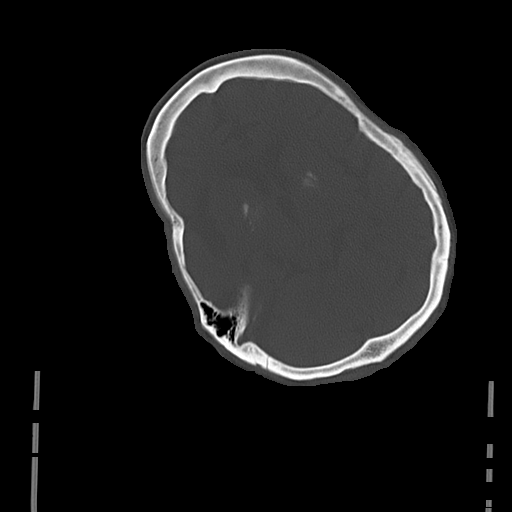
[im 24/44  bone]
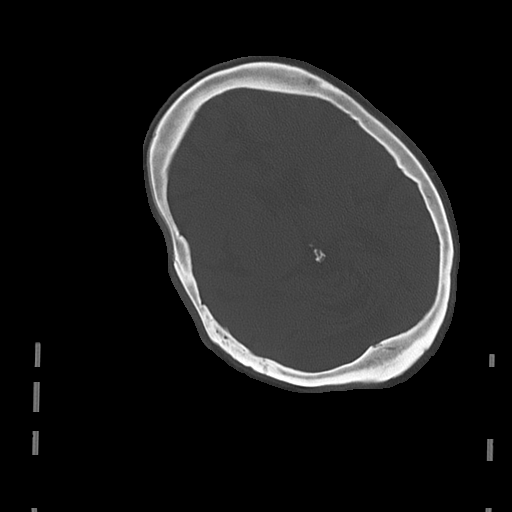
[im 29/44  bone]
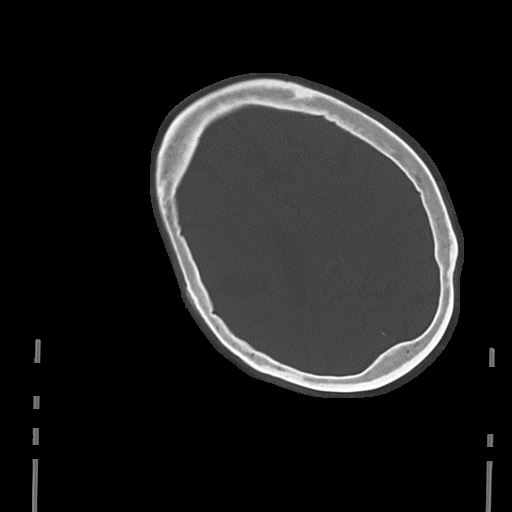
[im 34/44  bone]
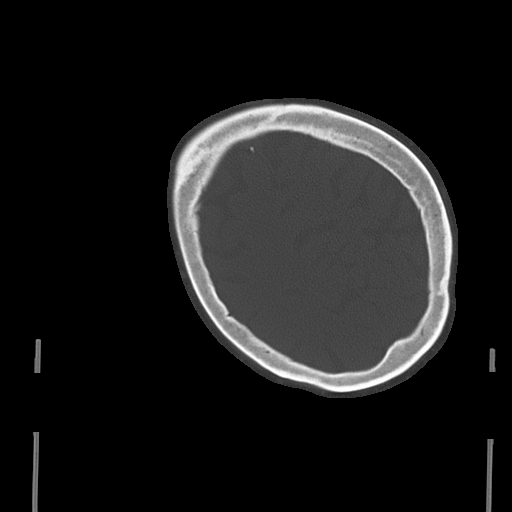
[im 39/44  bone]
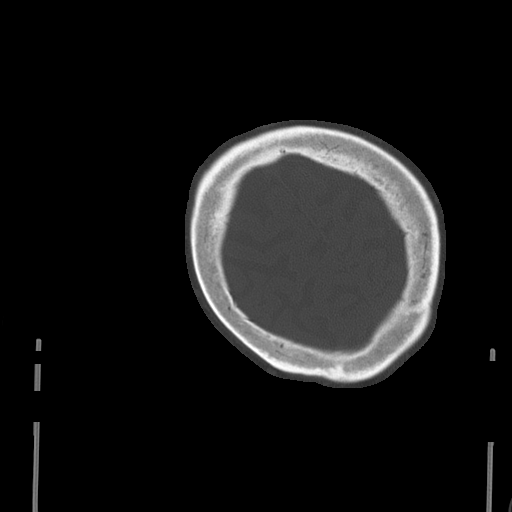

[18 of 30 positions shown; findings below may reference images not displayed]

FINDINGS: Study is intermittently degraded by motion artifact
despite repeated imaging attempts.

Visualized paranasal sinuses and mastoids are clear.  No acute
osseous abnormality identified.  No acute orbit or scalp soft
tissue findings.

Chronic confluent basal ganglia dystrophic calcifications.  Chronic
prominent linear calcification associated with the right foramen of
[place].  Stable cerebral volume.  No ventriculomegaly. No midline
shift, mass effect, or evidence of mass lesion.  No evidence of
cortically based acute infarction identified.  No acute
intracranial hemorrhage identified.  No suspicious intracranial
vascular hyperdensity.
IMPRESSION: Stable noncontrast CT appearance of the brain allowing for motion
artifact. No acute intracranial abnormality.  ]

## 2015-01-09 ENCOUNTER — Encounter: Payer: Self-pay | Admitting: *Deleted

## 2015-02-09 ENCOUNTER — Encounter: Payer: Self-pay | Admitting: *Deleted

## 2015-03-06 ENCOUNTER — Encounter: Payer: Self-pay | Admitting: *Deleted

## 2015-04-03 ENCOUNTER — Encounter: Payer: Self-pay | Admitting: *Deleted

## 2015-06-12 ENCOUNTER — Encounter: Payer: Self-pay | Admitting: *Deleted

## 2015-06-14 ENCOUNTER — Encounter: Payer: MEDICARE | Admitting: Internal Medicine

## 2015-09-28 ENCOUNTER — Encounter (HOSPITAL_COMMUNITY): Payer: Self-pay | Admitting: Emergency Medicine

## 2015-09-28 ENCOUNTER — Emergency Department (HOSPITAL_COMMUNITY): Payer: MEDICARE

## 2015-09-28 ENCOUNTER — Inpatient Hospital Stay (HOSPITAL_COMMUNITY)
Admission: EM | Admit: 2015-09-28 | Discharge: 2015-10-02 | DRG: 682 | Disposition: A | Discharge status: Skilled Nursing Facility | Payer: MEDICARE | Attending: Internal Medicine | Admitting: Internal Medicine

## 2015-09-28 DIAGNOSIS — Y92122 Bedroom in nursing home as the place of occurrence of the external cause: Secondary | ICD-10-CM | POA: Diagnosis not present

## 2015-09-28 DIAGNOSIS — S0081XA Abrasion of other part of head, initial encounter: Secondary | ICD-10-CM | POA: Diagnosis present

## 2015-09-28 DIAGNOSIS — D7589 Other specified diseases of blood and blood-forming organs: Secondary | ICD-10-CM | POA: Diagnosis present

## 2015-09-28 DIAGNOSIS — Z8542 Personal history of malignant neoplasm of other parts of uterus: Secondary | ICD-10-CM

## 2015-09-28 DIAGNOSIS — I480 Paroxysmal atrial fibrillation: Secondary | ICD-10-CM

## 2015-09-28 DIAGNOSIS — D696 Thrombocytopenia, unspecified: Secondary | ICD-10-CM | POA: Diagnosis present

## 2015-09-28 DIAGNOSIS — Z7401 Bed confinement status: Secondary | ICD-10-CM

## 2015-09-28 DIAGNOSIS — N179 Acute kidney failure, unspecified: Secondary | ICD-10-CM | POA: Diagnosis not present

## 2015-09-28 DIAGNOSIS — S0083XA Contusion of other part of head, initial encounter: Secondary | ICD-10-CM | POA: Diagnosis present

## 2015-09-28 DIAGNOSIS — I4891 Unspecified atrial fibrillation: Secondary | ICD-10-CM | POA: Diagnosis present

## 2015-09-28 DIAGNOSIS — R634 Abnormal weight loss: Secondary | ICD-10-CM | POA: Diagnosis not present

## 2015-09-28 DIAGNOSIS — F70 Mild intellectual disabilities: Secondary | ICD-10-CM | POA: Diagnosis present

## 2015-09-28 DIAGNOSIS — W06XXXA Fall from bed, initial encounter: Secondary | ICD-10-CM | POA: Diagnosis present

## 2015-09-28 DIAGNOSIS — E039 Hypothyroidism, unspecified: Secondary | ICD-10-CM | POA: Diagnosis present

## 2015-09-28 DIAGNOSIS — Z90710 Acquired absence of both cervix and uterus: Secondary | ICD-10-CM

## 2015-09-28 DIAGNOSIS — E878 Other disorders of electrolyte and fluid balance, not elsewhere classified: Secondary | ICD-10-CM | POA: Diagnosis present

## 2015-09-28 DIAGNOSIS — K219 Gastro-esophageal reflux disease without esophagitis: Secondary | ICD-10-CM | POA: Diagnosis present

## 2015-09-28 DIAGNOSIS — L8990 Pressure ulcer of unspecified site, unspecified stage: Secondary | ICD-10-CM | POA: Insufficient documentation

## 2015-09-28 DIAGNOSIS — N390 Urinary tract infection, site not specified: Secondary | ICD-10-CM | POA: Diagnosis present

## 2015-09-28 DIAGNOSIS — G92 Toxic encephalopathy: Secondary | ICD-10-CM | POA: Diagnosis present

## 2015-09-28 DIAGNOSIS — Z515 Encounter for palliative care: Secondary | ICD-10-CM

## 2015-09-28 DIAGNOSIS — B9620 Unspecified Escherichia coli [E. coli] as the cause of diseases classified elsewhere: Secondary | ICD-10-CM | POA: Diagnosis present

## 2015-09-28 DIAGNOSIS — E871 Hypo-osmolality and hyponatremia: Secondary | ICD-10-CM | POA: Diagnosis present

## 2015-09-28 DIAGNOSIS — F0280 Dementia in other diseases classified elsewhere without behavioral disturbance: Secondary | ICD-10-CM | POA: Diagnosis present

## 2015-09-28 DIAGNOSIS — I482 Chronic atrial fibrillation: Secondary | ICD-10-CM | POA: Diagnosis present

## 2015-09-28 DIAGNOSIS — E46 Unspecified protein-calorie malnutrition: Secondary | ICD-10-CM | POA: Diagnosis present

## 2015-09-28 DIAGNOSIS — Z66 Do not resuscitate: Secondary | ICD-10-CM | POA: Diagnosis present

## 2015-09-28 DIAGNOSIS — Z950 Presence of cardiac pacemaker: Secondary | ICD-10-CM | POA: Diagnosis not present

## 2015-09-28 DIAGNOSIS — Z681 Body mass index (BMI) 19 or less, adult: Secondary | ICD-10-CM

## 2015-09-28 DIAGNOSIS — N170 Acute kidney failure with tubular necrosis: Secondary | ICD-10-CM | POA: Diagnosis present

## 2015-09-28 DIAGNOSIS — D539 Nutritional anemia, unspecified: Secondary | ICD-10-CM | POA: Diagnosis present

## 2015-09-28 DIAGNOSIS — G309 Alzheimer's disease, unspecified: Secondary | ICD-10-CM | POA: Diagnosis present

## 2015-09-28 DIAGNOSIS — W19XXXA Unspecified fall, initial encounter: Secondary | ICD-10-CM | POA: Diagnosis not present

## 2015-09-28 DIAGNOSIS — Y92129 Unspecified place in nursing home as the place of occurrence of the external cause: Secondary | ICD-10-CM

## 2015-09-28 DIAGNOSIS — E870 Hyperosmolality and hypernatremia: Secondary | ICD-10-CM | POA: Diagnosis present

## 2015-09-28 DIAGNOSIS — G300 Alzheimer's disease with early onset: Secondary | ICD-10-CM | POA: Diagnosis not present

## 2015-09-28 DIAGNOSIS — I481 Persistent atrial fibrillation: Secondary | ICD-10-CM | POA: Diagnosis not present

## 2015-09-28 DIAGNOSIS — E860 Dehydration: Secondary | ICD-10-CM | POA: Diagnosis present

## 2015-09-28 LAB — CBC WITH DIFFERENTIAL/PLATELET
BASOS ABS: 0 10*3/uL (ref 0.0–0.1)
Basophils Relative: 0 %
EOS ABS: 0 10*3/uL (ref 0.0–0.7)
Eosinophils Relative: 0 %
HCT: 42.6 % (ref 36.0–46.0)
Hemoglobin: 12.6 g/dL (ref 12.0–15.0)
LYMPHS PCT: 35 %
Lymphs Abs: 3.7 10*3/uL (ref 0.7–4.0)
MCH: 35.4 pg — AB (ref 26.0–34.0)
MCHC: 29.6 g/dL — AB (ref 30.0–36.0)
MCV: 119.7 fL — ABNORMAL HIGH (ref 78.0–100.0)
MONO ABS: 0.3 10*3/uL (ref 0.1–1.0)
Monocytes Relative: 3 %
NEUTROS PCT: 62 %
Neutro Abs: 6.6 10*3/uL (ref 1.7–7.7)
PLATELETS: 77 10*3/uL — AB (ref 150–400)
RBC: 3.56 MIL/uL — AB (ref 3.87–5.11)
RDW: 16.1 % — ABNORMAL HIGH (ref 11.5–15.5)
WBC: 10.6 10*3/uL — ABNORMAL HIGH (ref 4.0–10.5)
nRBC: 1 /100 WBC — ABNORMAL HIGH

## 2015-09-28 LAB — BASIC METABOLIC PANEL
BUN: 100 mg/dL — ABNORMAL HIGH (ref 6–20)
BUN: 91 mg/dL — AB (ref 6–20)
CALCIUM: 9.2 mg/dL (ref 8.9–10.3)
CO2: 26 mmol/L (ref 22–32)
CO2: 25 mmol/L (ref 22–32)
CREATININE: 1.57 mg/dL — AB (ref 0.44–1.00)
Calcium: 8.3 mg/dL — ABNORMAL LOW (ref 8.9–10.3)
Chloride: >130 mmol/L (ref 101–111)
Creatinine, Ser: 1.85 mg/dL — ABNORMAL HIGH (ref 0.44–1.00)
GFR calc Af Amer: 32 mL/min — ABNORMAL LOW (ref >60)
GFR calc non Af Amer: 23 mL/min — ABNORMAL LOW (ref >60)
GFR calc non Af Amer: 28 mL/min — ABNORMAL LOW (ref >60)
GFR, EST AFRICAN AMERICAN: 26 mL/min — AB (ref >60)
GLUCOSE: 106 mg/dL — AB (ref 65–99)
Glucose, Bld: 75 mg/dL (ref 65–99)
Potassium: 4 mmol/L (ref 3.5–5.1)
Potassium: 3.7 mmol/L (ref 3.5–5.1)
Sodium: 171 mmol/L (ref 135–145)
Sodium: 170 mmol/L (ref 135–145)

## 2015-09-28 LAB — APTT: aPTT: 22 seconds — ABNORMAL LOW (ref 24–37)

## 2015-09-28 LAB — PROTIME-INR
INR: 1.14 (ref 0.00–1.49)
PROTHROMBIN TIME: 14.8 s (ref 11.6–15.2)

## 2015-09-28 LAB — BRAIN NATRIURETIC PEPTIDE: B Natriuretic Peptide: 336.5 pg/mL — ABNORMAL HIGH (ref 0.0–100.0)

## 2015-09-28 MED ORDER — LUBRIDERM SERIOUSLY SENSITIVE EX LOTN
1.0000 "application " | TOPICAL_LOTION | Freq: Every morning | CUTANEOUS | Status: DC
  Administered 2015-09-29 – 2015-10-01 (×3): 1 via TOPICAL
  Filled 2015-09-28: qty 473

## 2015-09-28 MED ORDER — MORPHINE SULFATE (PF) 2 MG/ML IV SOLN: 1.0000 mg | INTRAVENOUS | Status: DC | PRN

## 2015-09-28 MED ORDER — FLUTICASONE PROPIONATE 50 MCG/ACT NA SUSP
1.0000 | Freq: Every day | NASAL | Status: DC
  Administered 2015-10-01: 1 via NASAL
  Filled 2015-09-28: qty 16

## 2015-09-28 MED ORDER — MULTIVITAMIN & MINERAL PO LIQD: 1.0000 | Freq: Every day | ORAL | Status: DC

## 2015-09-28 MED ORDER — POLYVINYL ALCOHOL 1.4 % OP SOLN
1.0000 [drp] | Freq: Four times a day (QID) | OPHTHALMIC | Status: DC
  Administered 2015-09-29 – 2015-10-01 (×6): 1 [drp] via OPHTHALMIC
  Filled 2015-09-28: qty 15

## 2015-09-28 MED ORDER — SODIUM CHLORIDE 0.9 % IV SOLN: INTRAVENOUS | Status: DC

## 2015-09-28 MED ORDER — ACETAMINOPHEN 325 MG PO TABS: 650.0000 mg | ORAL_TABLET | Freq: Four times a day (QID) | ORAL | Status: DC | PRN

## 2015-09-28 MED ORDER — HYDROCODONE-ACETAMINOPHEN 5-325 MG PO TABS: 1.0000 | ORAL_TABLET | ORAL | Status: DC | PRN

## 2015-09-28 MED ORDER — LEVOTHYROXINE SODIUM 50 MCG PO TABS
50.0000 ug | ORAL_TABLET | Freq: Every day | ORAL | Status: DC
  Filled 2015-09-28: qty 1

## 2015-09-28 MED ORDER — POLYETHYLENE GLYCOL 3350 17 GM/SCOOP PO POWD: 17.0000 g | Freq: Every morning | ORAL | Status: DC

## 2015-09-28 MED ORDER — ADULT MULTIVITAMIN LIQUID CH
5.0000 mL | Freq: Every day | ORAL | Status: DC
  Filled 2015-09-28 (×4): qty 5

## 2015-09-28 MED ORDER — SENNOSIDES-DOCUSATE SODIUM 8.6-50 MG PO TABS: 1.0000 | ORAL_TABLET | Freq: Every evening | ORAL | Status: DC | PRN

## 2015-09-28 MED ORDER — HEPARIN SODIUM (PORCINE) 5000 UNIT/ML IJ SOLN: 5000.0000 [IU] | Freq: Three times a day (TID) | INTRAMUSCULAR | Status: DC

## 2015-09-28 MED ORDER — POLYETHYLENE GLYCOL 3350 17 G PO PACK: 17.0000 g | PACK | Freq: Every day | ORAL | Status: DC

## 2015-09-28 MED ORDER — CARBOXYMETHYLCELLULOSE SODIUM 1 % OP SOLN: 1.0000 [drp] | Freq: Four times a day (QID) | OPHTHALMIC | Status: DC

## 2015-09-28 MED ORDER — TRAMADOL HCL 50 MG PO TABS: 25.0000 mg | ORAL_TABLET | Freq: Every day | ORAL | Status: DC

## 2015-09-28 MED ORDER — ONDANSETRON HCL 4 MG PO TABS: 4.0000 mg | ORAL_TABLET | Freq: Four times a day (QID) | ORAL | Status: DC | PRN

## 2015-09-28 MED ORDER — SODIUM CHLORIDE 0.9 % IV BOLUS (SEPSIS)
500.0000 mL | Freq: Once | INTRAVENOUS | Status: AC
Start: 2015-09-28 — End: 2015-09-28
  Administered 2015-09-28: 500 mL via INTRAVENOUS

## 2015-09-28 MED ORDER — BISACODYL 5 MG PO TBEC
5.0000 mg | DELAYED_RELEASE_TABLET | Freq: Every day | ORAL | Status: DC | PRN
  Filled 2015-09-28: qty 1

## 2015-09-28 MED ORDER — SODIUM CHLORIDE 0.9 % IV BOLUS (SEPSIS): 500.0000 mL | Freq: Once | INTRAVENOUS | Status: DC

## 2015-09-28 MED ORDER — ACETAMINOPHEN 650 MG RE SUPP: 650.0000 mg | Freq: Four times a day (QID) | RECTAL | Status: DC | PRN

## 2015-09-28 MED ORDER — DEXTROSE 5 % IV SOLN
INTRAVENOUS | Status: DC
Start: 2015-09-28 — End: 2015-09-29
  Administered 2015-09-28: 85 mL via INTRAVENOUS

## 2015-09-28 MED ORDER — ONDANSETRON HCL 4 MG/2ML IJ SOLN: 4.0000 mg | Freq: Four times a day (QID) | INTRAMUSCULAR | Status: DC | PRN

## 2015-09-28 NOTE — ED Notes (Signed)
Per EMS pt from [facility] sent for evaluation unwitnessed fall; abrasion to central forehead; advanced dementia; no blood thinners.

## 2015-09-28 NOTE — ED Notes (Addendum)
Attempted to perform Stroke swallow Screen, unsuccessful, pt. Is incoherent, unable to follow command.confused. Hospitalist notified.

## 2015-09-28 NOTE — ED Notes (Signed)
Bed: WA09 Expected date:  Expected time:  Means of arrival:  Comments: Hold for Room 28

## 2015-09-28 NOTE — ED Notes (Signed)
Attempted x 3 to cath patient, unsuccessful x3.

## 2015-09-28 NOTE — H&P (Signed)
[facility] History and Physical  [name] [id number] DOB: [birth year] DOA: 09/28/2015  Referring physician: ED physician PCP: No primary care provider on file.  Specialists:  Dr. Lovena Le, cardiology   Chief Complaint:  Unwitnessed fall at SNF   HPI: [name] is a 82 y.o. female with PMH of complete heart block status post pacer placement, advanced Alzheimer's dementia with bedbound bound status, and hypothyroidism who presents from her SNF via EMS following an unwitnessed fall with resulting forehead abrasion. The patient apparently fell out of her bed sometime today and was noted by her SNF caretaker on the floor with a central forehead abrasion. She is nonverbal and bedbound at baseline but had assisted with her feeding up until 1-2 weeks ago, when per her caretaker she became less active. Site from her decreased activity, there was no appreciable change in her status according to her caretakers.  In ED, patient was found to be afebrile, saturating well on room air, and with vital stable. She was noted to have a central forehead abrasion and sent for a noncontrast head CT. CT revealed stable and severe cerebral atrophy but no acute intracranial process. Cervical spine CT was negative for acute fracture. Basic blood work returned with a serum sodium of 171, chloride of > 130, and serum creatinine of 1.85, up from apparent baseline of 1. Patient is also noted to be thrombocytopenic with platelet count of 77,000, and with a mild leukocytosis to 10,600, and MCV of 120. She'll be admitted to the hospital for ongoing evaluation and management of hypernatremia suspected secondary to dehydration.  Where does patient live?  SNF   Can patient participate in ADLs?  Barely        Review of Systems:   Unable to obtain secondary to the patient's clinical condition with advanced dementia and non-verbal state.   Allergy: No Known Allergies  Past Medical History  Diagnosis Date  . GERD  (gastroesophageal reflux disease)   . Dyslipidemia   . Atrial fibrillation ([place])   . Thrombocytopenia ([place])   . Anemia, macrocytic   . H/O: hysterectomy   . Anemia, macrocytic   . Hypokalemia   . Hypothyroidism   . Delirium   . Dementia   . Uterine cancer ([place])   . CHF (congestive heart failure) ([place])   . Uterine cancer ([place])   . Complete heart block [facility])     Past Surgical History  Procedure Laterality Date  . Cataract extraction  2002, 2003    bilateral  . Cardiac catheterization  03/2003    [name]  . Pacemaker insertion  2003  . Permanent pacemaker generator change N/A 04/18/2013    Procedure: PERMANENT PACEMAKER GENERATOR CHANGE;  Surgeon: Natalie Lance, MD;  Location: [facility] CATH LAB;  Service: Cardiovascular;  Laterality: N/A;    Social History:  reports that she has never smoked. She has never used smokeless tobacco. She reports that she does not drink alcohol or use illicit drugs.  Family History:  Unable to obtain a family history secondary to patient's clinical condition with advanced dementia and non-verbal state.   Prior to Admission medications   Medication Sig Start Date End Date Taking? Authorizing Provider  acetaminophen (TYLENOL) 325 MG tablet Take 325 mg by mouth 3 (three) times daily.    Yes Historical Provider, MD  carboxymethylcellulose (REFRESH) 1 % ophthalmic solution Apply 1 drop to eye 4 (four) times daily.   Yes Historical Provider, MD  Cranberry 250 MG CAPS Take 2 capsules  by mouth 2 (two) times daily.   Yes Historical Provider, MD  Emollient (EUCERIN) lotion Apply 5 mLs topically every morning. Apply to feet and ankles and lower extremities   Yes Historical Provider, MD  fluticasone (FLONASE) 50 MCG/ACT nasal spray Place 1 spray into the nose every morning.    Yes Historical Provider, MD  furosemide (LASIX) 20 MG tablet Take 20 mg by mouth daily.    Yes Historical Provider, MD  levothyroxine (SYNTHROID, LEVOTHROID) 50 MCG tablet Take 50 mcg by  mouth daily before breakfast.   Yes Historical Provider, MD  Multiple Vitamins-Minerals (MULTIVITAMIN & MINERAL PO) Take 1 tablet by mouth daily.   Yes Historical Provider, MD  nystatin (MYCOSTATIN) powder Apply 15 g topically 2 (two) times daily.   Yes Historical Provider, MD  polyethylene glycol powder (GLYCOLAX/MIRALAX) powder Take 17 g by mouth every morning.    Yes Historical Provider, MD  potassium chloride (MICRO-K) 10 MEQ CR capsule Take 1 capsule by mouth daily. 05/24/14  Yes Historical Provider, MD  traMADol (ULTRAM) 50 MG tablet Take 25 mg by mouth at bedtime.    Yes Historical Provider, MD  ketoconazole (NIZORAL) 2 % shampoo Apply 1 application topically once a week.    Historical Provider, MD    Physical Exam: Filed Vitals:   09/28/15 1714 09/28/15 1920  BP: 99/61 94/55  Pulse: 65 66  Temp: 98 F (36.7 C)   TempSrc: Oral   Resp: 16 18  SpO2: 97% 100%   General: Not in acute respiratory distress, cachectic HEENT:       Eyes: Cataracts b/l, eyelashes crusted over, scant white discharge, no scleral icterus or conjunctival pallor.       ENT: No discharge from the ears or nose, no pharyngeal ulcers, petechiae or exudate, no tonsillar enlargement.        Neck: No JVD, no bruit, no appreciable mass Heme: No cervical adenopathy, no pallor Cardiac: S1/S2, RRR, grade III early systolic murmur at upper LSB, No gallops or rubs. Pulm: Good air movement bilaterally. No rales, wheezing, rhonchi or rubs. Abd: Soft, nondistended, nontender, no rebound pain or gaurding, no mass or organomegaly, BS present. Ext: 1+ pedal pitting edema on right, trace on left. 1+DP/PT pulse bilaterally. Musculoskeletal: No gross deformity, no red, hot, swollen joints, ecchymosis adjacent to right mastoid  Skin:  Superficial abrasion on central forehead at hairline with mild underlying edema; distal LEs with hyperpigmentation and healed ulcerations   Neuro: Somnolent, not responding verbally, not following  commands, moving all extrems spontaneously, no facial asymmetry, PERRL, corneal reflex intact, withdraws from pain  Psych: Patient is not overtly psychotic, not responding.  Labs on Admission:  Basic Metabolic Panel:  Recent Labs Lab 09/28/15 1758  NA 171*  K 4.0  CL >130*  CO2 26  GLUCOSE 75  BUN 100*  CREATININE 1.85*  CALCIUM 9.2   Liver Function Tests: No results for input(s): AST, ALT, ALKPHOS, BILITOT, PROT, ALBUMIN in the last 168 hours. No results for input(s): LIPASE, AMYLASE in the last 168 hours. No results for input(s): AMMONIA in the last 168 hours. CBC:  Recent Labs Lab 09/28/15 1758  WBC 10.6*  NEUTROABS 6.6  HGB 12.6  HCT 42.6  MCV 119.7*  PLT 77*   Cardiac Enzymes: No results for input(s): CKTOTAL, CKMB, CKMBINDEX, TROPONINI in the last 168 hours.  BNP (last 3 results) No results for input(s): BNP in the last 8760 hours.  ProBNP (last 3 results) No results for input(s): PROBNP in  the last 8760 hours.  CBG: No results for input(s): GLUCAP in the last 168 hours.  Radiological Exams on Admission: Ct Head Wo Contrast  09/28/2015  CLINICAL DATA:  Unwitnessed fall tonight. EXAM: CT HEAD WITHOUT CONTRAST CT CERVICAL SPINE WITHOUT CONTRAST TECHNIQUE: Multidetector CT imaging of the head and cervical spine was performed following the standard protocol without intravenous contrast. Multiplanar CT image reconstructions of the cervical spine were also generated. COMPARISON:  01/09/2013 FINDINGS: CT HEAD FINDINGS Stable age related cerebral atrophy, ventriculomegaly and periventricular white matter disease. Stable dense basal ganglia calcifications. No extra-axial fluid collections are identified. No CT findings for acute hemispheric infarction or intracranial hemorrhage. No mass lesions. The brainstem and cerebellum are normal. No acute skull fracture. The paranasal sinuses and mastoid air cells are grossly clear. The globes are intact. CT CERVICAL SPINE FINDINGS  Normal alignment of the cervical vertebral bodies. Exaggerated cervical lordosis. No acute fracture. The facets are normally aligned. Moderate C1-2 degenerative changes but no dens fracture. The skullbase C1 and C1-2 articulations are maintained. The lung apices are grossly clear. IMPRESSION: Stable severe cerebral atrophy, ventriculomegaly and periventricular white matter disease. No acute intracranial findings or skull fracture. No acute cervical spine fracture. Electronically Signed   By: Marijo Sanes M.D.   On: 09/28/2015 18:36   Ct Cervical Spine Wo Contrast  09/28/2015  CLINICAL DATA:  Unwitnessed fall tonight. EXAM: CT HEAD WITHOUT CONTRAST CT CERVICAL SPINE WITHOUT CONTRAST TECHNIQUE: Multidetector CT imaging of the head and cervical spine was performed following the standard protocol without intravenous contrast. Multiplanar CT image reconstructions of the cervical spine were also generated. COMPARISON:  01/09/2013 FINDINGS: CT HEAD FINDINGS Stable age related cerebral atrophy, ventriculomegaly and periventricular white matter disease. Stable dense basal ganglia calcifications. No extra-axial fluid collections are identified. No CT findings for acute hemispheric infarction or intracranial hemorrhage. No mass lesions. The brainstem and cerebellum are normal. No acute skull fracture. The paranasal sinuses and mastoid air cells are grossly clear. The globes are intact. CT CERVICAL SPINE FINDINGS Normal alignment of the cervical vertebral bodies. Exaggerated cervical lordosis. No acute fracture. The facets are normally aligned. Moderate C1-2 degenerative changes but no dens fracture. The skullbase C1 and C1-2 articulations are maintained. The lung apices are grossly clear. IMPRESSION: Stable severe cerebral atrophy, ventriculomegaly and periventricular white matter disease. No acute intracranial findings or skull fracture. No acute cervical spine fracture. Electronically Signed   By: Marijo Sanes M.D.    On: 09/28/2015 18:36    EKG:  Not done in ED, ordered and pending   Assessment/Plan  1. Dehydration with hypernatremia, hyperchloremia  - Sodium level 171 on arrival  - Suspect secondary to dehydration in pt with advanced dementia and poor thirst mechanism  - Unknown chronicity; pt less active for 2 wks per SNF personnel  - IVF hydration with D5-W at 85 cc/hr  - Serial glucose and sodium levels q2h overnight, will adjust fluids prn  - Check urine studies, osms to clarify etio  - Goal is to drop sodium no lower than 161 over next 24 hrs  2. AKI - SCr 1.85 on admit, up from apparent b/l of 1 - Likely prerenal azotemia in setting of dehydration, ATN possible  - Anticipate improvement with IVF resuscitation  - Repeat chemistries in am    3. Thrombocytopenia  - Platelet count 77k on admission  - Appears chronic, but not typically this low  - Uncertain etiology  - Holding heparin products for now, using SCD only  for VTE ppx   - Trend   4. Macrocytosis  - MCV 119.7 on admission, higher than previous measures  - Will check vitamin levels, thyroid studies, reticulocyte count   - MDS also on differential  5. Atrial fibrillation  - Currently in sinus  - Not anticoagulated   6. Unwitnessed fall at SNF - CT head and C-spine without acute process  - Superficial abrasion on forehead   - Fall precautions     DVT ppx: SCDs  Code Status: Full code Family Communication: None at bed side.         Disposition Plan: Admit to inpatient   Date of Service 09/28/2015    Vianne Bulls, MD [facility] Pager [phone]  If 7PM-7AM, please contact night-coverage [email or web address] Password [email or web address] 09/28/2015, 7:59 PM

## 2015-09-28 NOTE — ED Notes (Signed)
Only got blue top,  Pt en route to floor

## 2015-09-28 NOTE — ED Provider Notes (Signed)
CSN: [id number]     Arrival date & time 09/28/15  1701 History   First MD Initiated Contact with Patient 09/28/15 1731     Chief Complaint  Patient presents with  . Fall     (Consider location/radiation/quality/duration/timing/severity/associated sxs/prior Treatment) HPI   [name] is a 82 y.o. female who presents for evaluation of injury to head. She apparently fell. It is unclear, however, she is bedbound, some likely fell from bed. She lives at a nursing care facility. She was transferred by EMS. There was no additional documentation of injuries, from the facility. Patient is unable to contribute to history.  Level V caveat- dementia   Past Medical History  Diagnosis Date  . GERD (gastroesophageal reflux disease)   . Dyslipidemia   . Atrial fibrillation ([place])   . Thrombocytopenia ([place])   . Anemia, macrocytic   . H/O: hysterectomy   . Anemia, macrocytic   . Hypokalemia   . Hypothyroidism   . Delirium   . Dementia   . Uterine cancer ([place])   . CHF (congestive heart failure) ([place])   . Uterine cancer ([place])   . Complete heart block [facility])    Past Surgical History  Procedure Laterality Date  . Cataract extraction  2002, 2003    bilateral  . Cardiac catheterization  03/2003    Mohan N. Harwani  . Pacemaker insertion  2003  . Permanent pacemaker generator change N/A 04/18/2013    Procedure: PERMANENT PACEMAKER GENERATOR CHANGE;  Surgeon: Evans Lance, MD;  Location: [facility] CATH LAB;  Service: Cardiovascular;  Laterality: N/A;   Family History  Problem Relation Age of Onset  . Family history unknown: Yes   Social History  Substance Use Topics  . Smoking status: Never Smoker   . Smokeless tobacco: Never Used  . Alcohol Use: No   OB History    No data available     Review of Systems  Unable to perform ROS: Dementia      Allergies  Review of patient's allergies indicates no known allergies.  Home Medications   Prior to Admission medications   Medication Sig  Start Date End Date Taking? Authorizing Provider  acetaminophen (TYLENOL) 325 MG tablet Take 325 mg by mouth 3 (three) times daily.    Yes Historical Provider, MD  carboxymethylcellulose (REFRESH) 1 % ophthalmic solution Apply 1 drop to eye 4 (four) times daily.   Yes Historical Provider, MD  Cranberry 250 MG CAPS Take 2 capsules by mouth 2 (two) times daily.   Yes Historical Provider, MD  Emollient (EUCERIN) lotion Apply 5 mLs topically every morning. Apply to feet and ankles and lower extremities   Yes Historical Provider, MD  fluticasone (FLONASE) 50 MCG/ACT nasal spray Place 1 spray into the nose every morning.    Yes Historical Provider, MD  furosemide (LASIX) 20 MG tablet Take 20 mg by mouth daily.    Yes Historical Provider, MD  levothyroxine (SYNTHROID, LEVOTHROID) 50 MCG tablet Take 50 mcg by mouth daily before breakfast.   Yes Historical Provider, MD  Multiple Vitamins-Minerals (MULTIVITAMIN & MINERAL PO) Take 1 tablet by mouth daily.   Yes Historical Provider, MD  nystatin (MYCOSTATIN) powder Apply 15 g topically 2 (two) times daily.   Yes Historical Provider, MD  polyethylene glycol powder (GLYCOLAX/MIRALAX) powder Take 17 g by mouth every morning.    Yes Historical Provider, MD  potassium chloride (MICRO-K) 10 MEQ CR capsule Take 1 capsule by mouth daily. 05/24/14  Yes Historical Provider, MD  traMADol (ULTRAM) 50 MG tablet Take 25 mg by mouth at bedtime.    Yes Historical Provider, MD  ketoconazole (NIZORAL) 2 % shampoo Apply 1 application topically once a week.    Historical Provider, MD   BP 103/81 mmHg  Pulse 84  Temp(Src) 97.8 F (36.6 C) (Oral)  Resp 16  Wt 102 lb 15.3 oz (46.7 kg)  SpO2 100% Physical Exam  Constitutional: She appears well-developed.  Elderly, frail  HENT:  Head: Normocephalic.  Right Ear: External ear normal.  Left Ear: External ear normal.  Contusion and abrasion midforehead, no associated depression, or bleeding.  Eyes: Conjunctivae and EOM are  normal. Pupils are equal, round, and reactive to light.  Neck: Normal range of motion and phonation normal. Neck supple.  Cardiovascular: Normal rate, regular rhythm and normal heart sounds.   Pulmonary/Chest: Effort normal and breath sounds normal. She exhibits no bony tenderness.  Abdominal: Soft. She exhibits no distension. There is no tenderness.  Musculoskeletal:  Bilateral contraction, arms and legs. No visible or palpable deformities.  Neurological: She is alert. No cranial nerve deficit or sensory deficit. She exhibits normal muscle tone. Coordination normal.  Skin: Skin is warm, dry and intact.  No rash.  Nursing note and vitals reviewed.   ED Course  Procedures (including critical care time)  Medications  levothyroxine (SYNTHROID, LEVOTHROID) tablet 50 mcg (not administered)  lubriderm seriously sensitive lotion 1 application (not administered)  fluticasone (FLONASE) 50 MCG/ACT nasal spray 1 spray (not administered)  traMADol (ULTRAM) tablet 25 mg (not administered)  dextrose 5 % solution ( Intravenous Transfusing/Transfer 09/28/15 2116)  acetaminophen (TYLENOL) tablet 650 mg (not administered)    Or  acetaminophen (TYLENOL) suppository 650 mg (not administered)  HYDROcodone-acetaminophen (NORCO/VICODIN) 5-325 MG per tablet 1-2 tablet (not administered)  morphine 2 MG/ML injection 1 mg (not administered)  senna-docusate (Senokot-S) tablet 1 tablet (not administered)  bisacodyl (DULCOLAX) EC tablet 5 mg (not administered)  ondansetron (ZOFRAN) tablet 4 mg (not administered)    Or  ondansetron (ZOFRAN) injection 4 mg (not administered)  multivitamin liquid 5 mL (not administered)  polyethylene glycol (MIRALAX / GLYCOLAX) packet 17 g (not administered)  polyvinyl alcohol (LIQUIFILM TEARS) 1.4 % ophthalmic solution 1 drop (not administered)  sodium chloride 0.9 % bolus 500 mL (500 mLs Intravenous Transfusing/Transfer 09/28/15 2116)    Patient Vitals for the past 24 hrs:  BP  Temp Temp src Pulse Resp SpO2 Weight  09/28/15 2143 103/81 mmHg 97.8 F (36.6 C) Oral 84 16 100 % 102 lb 15.3 oz (46.7 kg)  09/28/15 1920 94/55 mmHg - - 66 18 100 % -  09/28/15 1714 99/61 mmHg 98 F (36.7 C) Oral 65 16 97 % -      Labs Review Labs Reviewed  BASIC METABOLIC PANEL - Abnormal; Notable for the following:    Sodium 171 (*)    Chloride >130 (*)    BUN 100 (*)    Creatinine, Ser 1.85 (*)    GFR calc non Af Amer 23 (*)    GFR calc Af Amer 26 (*)    All other components within normal limits  CBC WITH DIFFERENTIAL/PLATELET - Abnormal; Notable for the following:    WBC 10.6 (*)    RBC 3.56 (*)    MCV 119.7 (*)    MCH 35.4 (*)    MCHC 29.6 (*)    RDW 16.1 (*)    Platelets 77 (*)    nRBC 1 (*)    All other components within  normal limits  APTT - Abnormal; Notable for the following:    aPTT 22 (*)    All other components within normal limits  BRAIN NATRIURETIC PEPTIDE - Abnormal; Notable for the following:    B Natriuretic Peptide 336.5 (*)    All other components within normal limits  BASIC METABOLIC PANEL - Abnormal; Notable for the following:    Sodium 170 (*)    Chloride >130 (*)    Glucose, Bld 106 (*)    BUN 91 (*)    Creatinine, Ser 1.57 (*)    Calcium 8.3 (*)    GFR calc non Af Amer 28 (*)    GFR calc Af Amer 32 (*)    All other components within normal limits  URINE CULTURE  PROTIME-INR  TSH  CBC WITH DIFFERENTIAL/PLATELET  URINALYSIS, ROUTINE W REFLEX MICROSCOPIC (NOT AT [facility])  T4, FREE  NA AND K (SODIUM & POTASSIUM), RAND UR  UREA NITROGEN, URINE  CREATININE, URINE, RANDOM  OSMOLALITY, URINE  BASIC METABOLIC PANEL  BASIC METABOLIC PANEL  BASIC METABOLIC PANEL  BASIC METABOLIC PANEL  OSMOLALITY  VITAMIN B12  FOLATE RBC  FERRITIN  IRON AND TIBC    Imaging Review Ct Head Wo Contrast  09/28/2015  CLINICAL DATA:  Unwitnessed fall tonight. EXAM: CT HEAD WITHOUT CONTRAST CT CERVICAL SPINE WITHOUT CONTRAST TECHNIQUE: Multidetector CT imaging  of the head and cervical spine was performed following the standard protocol without intravenous contrast. Multiplanar CT image reconstructions of the cervical spine were also generated. COMPARISON:  01/09/2013 FINDINGS: CT HEAD FINDINGS Stable age related cerebral atrophy, ventriculomegaly and periventricular white matter disease. Stable dense basal ganglia calcifications. No extra-axial fluid collections are identified. No CT findings for acute hemispheric infarction or intracranial hemorrhage. No mass lesions. The brainstem and cerebellum are normal. No acute skull fracture. The paranasal sinuses and mastoid air cells are grossly clear. The globes are intact. CT CERVICAL SPINE FINDINGS Normal alignment of the cervical vertebral bodies. Exaggerated cervical lordosis. No acute fracture. The facets are normally aligned. Moderate C1-2 degenerative changes but no dens fracture. The skullbase C1 and C1-2 articulations are maintained. The lung apices are grossly clear. IMPRESSION: Stable severe cerebral atrophy, ventriculomegaly and periventricular white matter disease. No acute intracranial findings or skull fracture. No acute cervical spine fracture. Electronically Signed   By: Marijo Sanes M.D.   On: 09/28/2015 18:36   Ct Cervical Spine Wo Contrast  09/28/2015  CLINICAL DATA:  Unwitnessed fall tonight. EXAM: CT HEAD WITHOUT CONTRAST CT CERVICAL SPINE WITHOUT CONTRAST TECHNIQUE: Multidetector CT imaging of the head and cervical spine was performed following the standard protocol without intravenous contrast. Multiplanar CT image reconstructions of the cervical spine were also generated. COMPARISON:  01/09/2013 FINDINGS: CT HEAD FINDINGS Stable age related cerebral atrophy, ventriculomegaly and periventricular white matter disease. Stable dense basal ganglia calcifications. No extra-axial fluid collections are identified. No CT findings for acute hemispheric infarction or intracranial hemorrhage. No mass lesions.  The brainstem and cerebellum are normal. No acute skull fracture. The paranasal sinuses and mastoid air cells are grossly clear. The globes are intact. CT CERVICAL SPINE FINDINGS Normal alignment of the cervical vertebral bodies. Exaggerated cervical lordosis. No acute fracture. The facets are normally aligned. Moderate C1-2 degenerative changes but no dens fracture. The skullbase C1 and C1-2 articulations are maintained. The lung apices are grossly clear. IMPRESSION: Stable severe cerebral atrophy, ventriculomegaly and periventricular white matter disease. No acute intracranial findings or skull fracture. No acute cervical spine fracture. Electronically Signed  By: Marijo Sanes M.D.   On: 09/28/2015 18:36   I have personally reviewed and evaluated these images and lab results as part of my medical decision-making.   EKG Interpretation None      MDM   Final diagnoses:  Hypernatremia  Hyperchloremia  Dehydration    Fall, without serious injury. Incidental marked hypernatremia and hyperchloremia likely related to dehydration. No overt infection. Patient will require admission for treatment and stabilization with IV fluids.  Nursing Notes Reviewed/ Care Coordinated Applicable Imaging Reviewed Interpretation of Laboratory Data incorporated into ED treatment   Plan: Admit    Daleen Bo, MD 09/28/15 2333

## 2015-09-29 DIAGNOSIS — I482 Chronic atrial fibrillation: Secondary | ICD-10-CM

## 2015-09-29 DIAGNOSIS — Z515 Encounter for palliative care: Secondary | ICD-10-CM

## 2015-09-29 DIAGNOSIS — G309 Alzheimer's disease, unspecified: Secondary | ICD-10-CM

## 2015-09-29 DIAGNOSIS — R634 Abnormal weight loss: Secondary | ICD-10-CM

## 2015-09-29 DIAGNOSIS — L8990 Pressure ulcer of unspecified site, unspecified stage: Secondary | ICD-10-CM | POA: Insufficient documentation

## 2015-09-29 DIAGNOSIS — N390 Urinary tract infection, site not specified: Secondary | ICD-10-CM

## 2015-09-29 DIAGNOSIS — E46 Unspecified protein-calorie malnutrition: Secondary | ICD-10-CM

## 2015-09-29 DIAGNOSIS — G92 Toxic encephalopathy: Secondary | ICD-10-CM

## 2015-09-29 DIAGNOSIS — Z66 Do not resuscitate: Secondary | ICD-10-CM | POA: Diagnosis present

## 2015-09-29 DIAGNOSIS — F0280 Dementia in other diseases classified elsewhere without behavioral disturbance: Secondary | ICD-10-CM

## 2015-09-29 LAB — BASIC METABOLIC PANEL
BUN: 70 mg/dL — ABNORMAL HIGH (ref 6–20)
BUN: 76 mg/dL — AB (ref 6–20)
BUN: 82 mg/dL — AB (ref 6–20)
BUN: 83 mg/dL — AB (ref 6–20)
BUN: 89 mg/dL — AB (ref 6–20)
CALCIUM: 8.3 mg/dL — AB (ref 8.9–10.3)
CALCIUM: 8.4 mg/dL — AB (ref 8.9–10.3)
CO2: 21 mmol/L — AB (ref 22–32)
CO2: 25 mmol/L (ref 22–32)
CO2: 25 mmol/L (ref 22–32)
CO2: 26 mmol/L (ref 22–32)
CO2: 26 mmol/L (ref 22–32)
CREATININE: 1.3 mg/dL — AB (ref 0.44–1.00)
CREATININE: 1.44 mg/dL — AB (ref 0.44–1.00)
CREATININE: 1.48 mg/dL — AB (ref 0.44–1.00)
CREATININE: 1.5 mg/dL — AB (ref 0.44–1.00)
Calcium: 8.5 mg/dL — ABNORMAL LOW (ref 8.9–10.3)
Calcium: 8.3 mg/dL — ABNORMAL LOW (ref 8.9–10.3)
Calcium: 8.2 mg/dL — ABNORMAL LOW (ref 8.9–10.3)
Chloride: >130 mmol/L (ref 101–111)
Chloride: >130 mmol/L (ref 101–111)
Chloride: >130 mmol/L (ref 101–111)
Creatinine, Ser: 1.24 mg/dL — ABNORMAL HIGH (ref 0.44–1.00)
GFR calc Af Amer: 34 mL/min — ABNORMAL LOW (ref >60)
GFR calc Af Amer: 34 mL/min — ABNORMAL LOW (ref >60)
GFR calc Af Amer: 36 mL/min — ABNORMAL LOW (ref >60)
GFR calc Af Amer: 40 mL/min — ABNORMAL LOW (ref >60)
GFR calc non Af Amer: 35 mL/min — ABNORMAL LOW (ref >60)
GFR calc non Af Amer: 37 mL/min — ABNORMAL LOW (ref >60)
GFR, EST AFRICAN AMERICAN: 43 mL/min — AB (ref >60)
GFR, EST NON AFRICAN AMERICAN: 29 mL/min — AB (ref >60)
GFR, EST NON AFRICAN AMERICAN: 30 mL/min — AB (ref >60)
GFR, EST NON AFRICAN AMERICAN: 31 mL/min — AB (ref >60)
GLUCOSE: 115 mg/dL — AB (ref 65–99)
GLUCOSE: 128 mg/dL — AB (ref 65–99)
GLUCOSE: 129 mg/dL — AB (ref 65–99)
GLUCOSE: 134 mg/dL — AB (ref 65–99)
Glucose, Bld: 127 mg/dL — ABNORMAL HIGH (ref 65–99)
POTASSIUM: 3.8 mmol/L (ref 3.5–5.1)
Potassium: 3.5 mmol/L (ref 3.5–5.1)
Potassium: 3.4 mmol/L — ABNORMAL LOW (ref 3.5–5.1)
Potassium: 3.2 mmol/L — ABNORMAL LOW (ref 3.5–5.1)
Potassium: 3.6 mmol/L (ref 3.5–5.1)
SODIUM: 166 mmol/L — AB (ref 135–145)
SODIUM: 168 mmol/L — AB (ref 135–145)
SODIUM: 169 mmol/L — AB (ref 135–145)
SODIUM: 169 mmol/L — AB (ref 135–145)
SODIUM: 170 mmol/L — AB (ref 135–145)

## 2015-09-29 LAB — URINALYSIS, ROUTINE W REFLEX MICROSCOPIC
BILIRUBIN URINE: NEGATIVE
Glucose, UA: NEGATIVE mg/dL
Ketones, ur: NEGATIVE mg/dL
NITRITE: POSITIVE — AB
Protein, ur: NEGATIVE mg/dL
SPECIFIC GRAVITY, URINE: 1.019 (ref 1.005–1.030)
pH: 5.5 (ref 5.0–8.0)

## 2015-09-29 LAB — CBC WITH DIFFERENTIAL/PLATELET
BASOS ABS: 0 10*3/uL (ref 0.0–0.1)
Basophils Relative: 0 %
EOS ABS: 0 10*3/uL (ref 0.0–0.7)
Eosinophils Relative: 0 %
HCT: 37 % (ref 36.0–46.0)
HEMOGLOBIN: 11 g/dL — AB (ref 12.0–15.0)
LYMPHS PCT: 32 %
Lymphs Abs: 2.9 10*3/uL (ref 0.7–4.0)
MCH: 34.5 pg — ABNORMAL HIGH (ref 26.0–34.0)
MCHC: 29.7 g/dL — ABNORMAL LOW (ref 30.0–36.0)
MCV: 116 fL — ABNORMAL HIGH (ref 78.0–100.0)
MONOS PCT: 5 %
Monocytes Absolute: 0.5 10*3/uL (ref 0.1–1.0)
NEUTROS PCT: 63 %
Neutro Abs: 5.8 10*3/uL (ref 1.7–7.7)
Platelets: 66 10*3/uL — ABNORMAL LOW (ref 150–400)
RBC: 3.19 MIL/uL — AB (ref 3.87–5.11)
RDW: 15.7 % — ABNORMAL HIGH (ref 11.5–15.5)
WBC: 9.2 10*3/uL (ref 4.0–10.5)

## 2015-09-29 LAB — IRON AND TIBC
Iron: 43 ug/dL (ref 28–170)
SATURATION RATIOS: 22 % (ref 10.4–31.8)
TIBC: 192 ug/dL — AB (ref 250–450)
UIBC: 149 ug/dL

## 2015-09-29 LAB — URINE MICROSCOPIC-ADD ON: Squamous Epithelial / LPF: NONE SEEN

## 2015-09-29 LAB — VITAMIN B12: VITAMIN B 12: 555 pg/mL (ref 180–914)

## 2015-09-29 LAB — RETICULOCYTES
RBC.: 3.24 MIL/uL — AB (ref 3.87–5.11)
RETIC CT PCT: 1.6 % (ref 0.4–3.1)
Retic Count, Absolute: 51.8 10*3/uL (ref 19.0–186.0)

## 2015-09-29 LAB — T4, FREE: Free T4: 0.53 ng/dL — ABNORMAL LOW (ref 0.61–1.12)

## 2015-09-29 LAB — CREATININE, URINE, RANDOM: CREATININE, URINE: 58.88 mg/dL

## 2015-09-29 LAB — FERRITIN: Ferritin: 952 ng/mL — ABNORMAL HIGH (ref 11–307)

## 2015-09-29 LAB — OSMOLALITY, URINE: OSMOLALITY UR: 612 mosm/kg (ref 300–900)

## 2015-09-29 LAB — MRSA PCR SCREENING: MRSA BY PCR: POSITIVE — AB

## 2015-09-29 LAB — NA AND K (SODIUM & POTASSIUM), RAND UR
POTASSIUM UR: 34 mmol/L
SODIUM UR: 59 mmol/L

## 2015-09-29 LAB — TSH: TSH: 1.018 u[IU]/mL (ref 0.350–4.500)

## 2015-09-29 MED ORDER — MUPIROCIN 2 % EX OINT
1.0000 "application " | TOPICAL_OINTMENT | Freq: Two times a day (BID) | CUTANEOUS | Status: DC
  Administered 2015-09-30 – 2015-10-01 (×4): 1 via NASAL
  Filled 2015-09-29 (×2): qty 22

## 2015-09-29 MED ORDER — LIP MEDEX EX OINT
TOPICAL_OINTMENT | CUTANEOUS | Status: AC
Start: 2015-09-29 — End: 2015-09-29
  Administered 2015-09-29: 1
  Filled 2015-09-29: qty 7

## 2015-09-29 MED ORDER — DEXTROSE 5 % IV SOLN
1.0000 g | INTRAVENOUS | Status: DC
  Administered 2015-09-29 – 2015-10-02 (×4): 1 g via INTRAVENOUS
  Filled 2015-09-29 (×4): qty 10

## 2015-09-29 MED ORDER — DEXTROSE 5 % IV SOLN
INTRAVENOUS | Status: AC
  Administered 2015-09-29: 22:00:00 via INTRAVENOUS

## 2015-09-29 MED ORDER — CETYLPYRIDINIUM CHLORIDE 0.05 % MT LIQD
7.0000 mL | Freq: Two times a day (BID) | OROMUCOSAL | Status: DC
  Administered 2015-10-01 (×2): 7 mL via OROMUCOSAL

## 2015-09-29 MED ORDER — CHLORHEXIDINE GLUCONATE CLOTH 2 % EX PADS
6.0000 | MEDICATED_PAD | Freq: Every day | CUTANEOUS | Status: DC
  Administered 2015-09-29 – 2015-10-01 (×3): 6 via TOPICAL

## 2015-09-29 MED ORDER — DEXTROSE 5 % IV SOLN
INTRAVENOUS | Status: AC
Start: 2015-09-29 — End: 2015-09-29
  Administered 2015-09-29: 11:00:00 via INTRAVENOUS

## 2015-09-29 MED ORDER — CHLORHEXIDINE GLUCONATE 0.12 % MT SOLN
15.0000 mL | Freq: Two times a day (BID) | OROMUCOSAL | Status: DC
Start: 2015-09-29 — End: 2015-10-02
  Administered 2015-10-01: 15 mL via OROMUCOSAL
  Filled 2015-09-29: qty 15

## 2015-09-29 NOTE — Progress Notes (Signed)
Paged NP Kathline Magic about ua results and IVF order expiring. Orders received.

## 2015-09-29 NOTE — Progress Notes (Addendum)
[facility] PROGRESS NOTE    Progress Note   [name] [id number] DOB: 1922-07-07 DOA: 09/28/2015 PCP: No primary care provider on file.   Brief Narrative:   [name] is an 82 y.o. female with PMH of complete heart block status post pacer placement, advanced Alzheimer's dementia with bedbound bound status, and hypothyroidism who presents from her SNF via EMS following an unwitnessed fall with resulting forehead abrasion. The patient apparently fell out of her bed sometime today and was noted by her SNF caretaker on the floor with a central forehead abrasion. She is nonverbal and bedbound at baseline but had assisted with her feeding up until 1-2 weeks ago,   Assessment/Plan:  Acute toxic metabolic encephalopathy due to Hypernatremia and possibly UTI: Probably multifactorial, in patient with advanced dementia. We'll have to discuss with family goals of care. Fractional secretion of sodium was less than one specific gravity 1020 Continue D5 check basic metabolic panel every 8 hours. UA showed too many to count white blood cells and bacteria I agree with IV empiric Rocephin urine cultures are pending. Strong strong urine smell is going to the room.  Acute kidney injury: Found improvement with D5 likely prerenal in etiology, fractional excretion of sodium was less than 1.  Leukocytosis: Has remained afebrile currently on Rocephin. Leukocytosis has resolved his most likely due to UTI.  Thrombocytopenia: Question is do to infectious etiology.  Macrocytic anemia: B-12 folate and TSH are pending. Mild drop in hemoglobin likely hemodilution.  Chronic atrial fibrillation: In sinus rhythm none to granulation as an outpatient.  Unwitnessed fall: check a CK CT scan of the head and C-spine showed no acute findings.  Protein calorie malnutrition (HCC)    DVT Prophylaxis - Lovenox ordered.  Family Communication: none Disposition Plan: Home when stable. Code Status:       Code Status Orders        Start     Ordered   09/28/15 1946  Full code   Continuous     09/28/15 1954    Code Status History    Date Active Date Inactive Code Status Order ID Comments User Context   This patient has a current code status but no historical code status.        IV Access:    Peripheral IV   Procedures and diagnostic studies:   Ct Head Wo Contrast  09/28/2015  CLINICAL DATA:  Unwitnessed fall tonight. EXAM: CT HEAD WITHOUT CONTRAST CT CERVICAL SPINE WITHOUT CONTRAST TECHNIQUE: Multidetector CT imaging of the head and cervical spine was performed following the standard protocol without intravenous contrast. Multiplanar CT image reconstructions of the cervical spine were also generated. COMPARISON:  01/09/2013 FINDINGS: CT HEAD FINDINGS Stable age related cerebral atrophy, ventriculomegaly and periventricular white matter disease. Stable dense basal ganglia calcifications. No extra-axial fluid collections are identified. No CT findings for acute hemispheric infarction or intracranial hemorrhage. No mass lesions. The brainstem and cerebellum are normal. No acute skull fracture. The paranasal sinuses and mastoid air cells are grossly clear. The globes are intact. CT CERVICAL SPINE FINDINGS Normal alignment of the cervical vertebral bodies. Exaggerated cervical lordosis. No acute fracture. The facets are normally aligned. Moderate C1-2 degenerative changes but no dens fracture. The skullbase C1 and C1-2 articulations are maintained. The lung apices are grossly clear. IMPRESSION: Stable severe cerebral atrophy, ventriculomegaly and periventricular white matter disease. No acute intracranial findings or skull fracture. No acute cervical spine fracture. Electronically Signed   By: Ricky Stabs.D.  On: 09/28/2015 18:36   Ct Cervical Spine Wo Contrast  09/28/2015  CLINICAL DATA:  Unwitnessed fall tonight. EXAM: CT HEAD WITHOUT CONTRAST CT CERVICAL SPINE WITHOUT CONTRAST  TECHNIQUE: Multidetector CT imaging of the head and cervical spine was performed following the standard protocol without intravenous contrast. Multiplanar CT image reconstructions of the cervical spine were also generated. COMPARISON:  01/09/2013 FINDINGS: CT HEAD FINDINGS Stable age related cerebral atrophy, ventriculomegaly and periventricular white matter disease. Stable dense basal ganglia calcifications. No extra-axial fluid collections are identified. No CT findings for acute hemispheric infarction or intracranial hemorrhage. No mass lesions. The brainstem and cerebellum are normal. No acute skull fracture. The paranasal sinuses and mastoid air cells are grossly clear. The globes are intact. CT CERVICAL SPINE FINDINGS Normal alignment of the cervical vertebral bodies. Exaggerated cervical lordosis. No acute fracture. The facets are normally aligned. Moderate C1-2 degenerative changes but no dens fracture. The skullbase C1 and C1-2 articulations are maintained. The lung apices are grossly clear. IMPRESSION: Stable severe cerebral atrophy, ventriculomegaly and periventricular white matter disease. No acute intracranial findings or skull fracture. No acute cervical spine fracture. Electronically Signed   By: Marijo Sanes M.D.   On: 09/28/2015 18:36     Medical Consultants:    None.  Anti-Infectives:   Anti-infectives    Start     Dose/Rate Route Frequency Ordered Stop   09/29/15 0600  cefTRIAXone (ROCEPHIN) 1 g in dextrose 5 % 50 mL IVPB     1 g 100 mL/hr over 30 Minutes Intravenous Every 24 hours 09/29/15 0548        Subjective:    [name] patient is nonverbal.  Objective:    Filed Vitals:   09/28/15 1714 09/28/15 1920 09/28/15 2143 09/29/15 0625  BP: 99/61 94/55 103/81 102/43  Pulse: 65 66 84 59  Temp: 98 F (36.7 C)  97.8 F (36.6 C) 97.5 F (36.4 C)  TempSrc: Oral  Oral Oral  Resp: 16 18 16 14   Weight:   46.7 kg (102 lb 15.3 oz) 45.8 kg (100 lb 15.5 oz)  SpO2: 97%  100% 100% 100%   No intake or output data in the 24 hours ending 09/29/15 0929 Filed Weights   09/28/15 2143 09/29/15 0625  Weight: 46.7 kg (102 lb 15.3 oz) 45.8 kg (100 lb 15.5 oz)    Exam: Gen:  NAD, only responsive to painfull stimuli Cardiovascular:  RRR,  Chest and lungs:   Good air movement clear to auscultation Abdomen:  Abdomen is soft nondistended positive bowel sounds, she grimaces  when you press on the suprapubic area Extremities:  No edema   Data Reviewed:    Labs: Basic Metabolic Panel:  Recent Labs Lab 09/28/15 2215 09/29/15 0022 09/29/15 0210 09/29/15 0418 09/29/15 0646  NA 170* 170* 169* 169* 168*  K 3.7 3.8 3.5 3.4* 3.2*  CL >130* >130* >130* >130* >130*  CO2 25 26 25 26 25   GLUCOSE 106* 128* 134* 129* 127*  BUN 91* 89* 83* 82* 76*  CREATININE 1.57* 1.50* 1.48* 1.44* 1.30*  CALCIUM 8.3* 8.4* 8.5* 8.3* 8.3*   GFR CrCl cannot be calculated (Unknown ideal weight.). Liver Function Tests: No results for input(s): AST, ALT, ALKPHOS, BILITOT, PROT, ALBUMIN in the last 168 hours. No results for input(s): LIPASE, AMYLASE in the last 168 hours. No results for input(s): AMMONIA in the last 168 hours. Coagulation profile  Recent Labs Lab 09/28/15 2039  INR 1.14    CBC:  Recent Labs Lab 09/28/15 1758  09/29/15 0418  WBC 10.6* 9.2  NEUTROABS 6.6 5.8  HGB 12.6 11.0*  HCT 42.6 37.0  MCV 119.7* 116.0*  PLT 77* 66*   Cardiac Enzymes: No results for input(s): CKTOTAL, CKMB, CKMBINDEX, TROPONINI in the last 168 hours. BNP (last 3 results) No results for input(s): PROBNP in the last 8760 hours. CBG: No results for input(s): GLUCAP in the last 168 hours. D-Dimer: No results for input(s): DDIMER in the last 72 hours. Hgb A1c: No results for input(s): HGBA1C in the last 72 hours. Lipid Profile: No results for input(s): CHOL, HDL, LDLCALC, TRIG, CHOLHDL, LDLDIRECT in the last 72 hours. Thyroid function studies:  Recent Labs  09/29/15 0418  TSH  1.018   Anemia work up:  Recent Labs  09/29/15 0418  RETICCTPCT 1.6   Sepsis Labs:  Recent Labs Lab 09/28/15 1758 09/29/15 0418  WBC 10.6* 9.2   Microbiology Recent Results (from the past 240 hour(s))  MRSA PCR Screening     Status: Abnormal   Collection Time: 09/29/15 12:32 AM  Result Value Ref Range Status   MRSA by PCR POSITIVE (A) NEGATIVE Final    Comment:        The GeneXpert MRSA Assay (FDA approved for NASAL specimens only), is one component of a comprehensive MRSA colonization surveillance program. It is not intended to diagnose MRSA infection nor to guide or monitor treatment for MRSA infections. RESULT CALLED TO, READ BACK BY AND VERIFIED WITHRevonda Humphrey RN [id number] 09/29/15 A NAVARRO      Medications:   . cefTRIAXone (ROCEPHIN)  IV  1 g Intravenous Q24H  . Chlorhexidine Gluconate Cloth  6 each Topical Q0600  . fluticasone  1 spray Each Nare Daily  . levothyroxine  50 mcg Oral QAC breakfast  . lip balm      . lubriderm seriously sensitive  1 application Topical q morning - 10a  . multivitamin  5 mL Oral Daily  . mupirocin ointment  1 application Nasal BID  . polyethylene glycol  17 g Oral Daily  . polyvinyl alcohol  1 drop Both Eyes QID  . traMADol  25 mg Oral QHS   Continuous Infusions: . dextrose      Time spent: 35 min   LOS: 1 day   Charlynne Cousins  [facility] Pager [phone]  *Please refer to [email or web address], password [email or web address] to get updated schedule on who will round on this patient, as hospitalists switch teams weekly. If 7PM-7AM, please contact night-coverage at [email or web address], password [email or web address] for any overnight needs.  09/29/2015, 9:29 AM

## 2015-09-29 NOTE — Plan of Care (Signed)
Problem: Skin Integrity: Goal: Risk for impaired skin integrity will decrease Outcome: Progressing Patient turned and repositioned in bed. allevyn dsg applied to r hip wound.

## 2015-09-29 NOTE — Progress Notes (Addendum)
Patient admitted to room 1329 from ED. Patient with dementia and no family with her. Bed alarm set.

## 2015-09-29 NOTE — Progress Notes (Signed)
NP Kathline Magic called about need for urine studies. Verbal order for in and out cath obtained.

## 2015-09-29 NOTE — Progress Notes (Signed)
ANTIBIOTIC CONSULT NOTE - INITIAL  Pharmacy Consult for Rocephin Indication: UTI  No Known Allergies  Patient Measurements: Weight: 102 lb 15.3 oz (46.7 kg)   Vital Signs: Temp: 97.8 F (36.6 C) (01/20 2143) Temp Source: Oral (01/20 2143) BP: 103/81 mmHg (01/20 2143) Pulse Rate: 84 (01/20 2143) Intake/Output from previous day:   Intake/Output from this shift:    Labs:  Recent Labs  09/28/15 1758  09/29/15 0022 09/29/15 0210 09/29/15 0418  WBC 10.6*  --   --   --  9.2  HGB 12.6  --   --   --  11.0*  PLT 77*  --   --   --  66*  CREATININE 1.85*  < > 1.50* 1.48* 1.44*  < > = values in this interval not displayed. CrCl cannot be calculated (Unknown ideal weight.). No results for input(s): VANCOTROUGH, VANCOPEAK, VANCORANDOM, GENTTROUGH, GENTPEAK, GENTRANDOM, TOBRATROUGH, TOBRAPEAK, TOBRARND, AMIKACINPEAK, AMIKACINTROU, AMIKACIN in the last 72 hours.   Microbiology: Recent Results (from the past 720 hour(s))  MRSA PCR Screening     Status: Abnormal   Collection Time: 09/29/15 12:32 AM  Result Value Ref Range Status   MRSA by PCR POSITIVE (A) NEGATIVE Final    Comment:        The GeneXpert MRSA Assay (FDA approved for NASAL specimens only), is one component of a comprehensive MRSA colonization surveillance program. It is not intended to diagnose MRSA infection nor to guide or monitor treatment for MRSA infections. RESULT CALLED TO, READ BACK BY AND VERIFIED WITH: Revonda Humphrey RN [id number] 09/29/15 A NAVARRO     Medical History: Past Medical History  Diagnosis Date  . GERD (gastroesophageal reflux disease)   . Dyslipidemia   . Atrial fibrillation ([place])   . Thrombocytopenia ([place])   . Anemia, macrocytic   . H/O: hysterectomy   . Anemia, macrocytic   . Hypokalemia   . Hypothyroidism   . Delirium   . Dementia   . Uterine cancer ([place])   . CHF (congestive heart failure) ([place])   . Uterine cancer ([place])   . Complete heart block (HCC)     Medications:  Scheduled:   . cefTRIAXone (ROCEPHIN)  IV  1 g Intravenous Q24H  . fluticasone  1 spray Each Nare Daily  . levothyroxine  50 mcg Oral QAC breakfast  . lip balm      . lubriderm seriously sensitive  1 application Topical q morning - 10a  . multivitamin  5 mL Oral Daily  . polyethylene glycol  17 g Oral Daily  . polyvinyl alcohol  1 drop Both Eyes QID  . traMADol  25 mg Oral QHS   Infusions:  . dextrose     Assessment: 4 yoF s/p unwitnessed fall at SNF.  Rocephin per Rx for UTI.   Goal of Therapy:  Treat UTI  Plan:   Rocephin 1 Gm IV q24h  Rx will sign off secondary to no further adjustments needed  Lawana Pai R 09/29/2015,5:49 AM

## 2015-09-29 NOTE — Progress Notes (Addendum)
Serum osmolarity 386 paged NP Kathline Magic to make him aware. No new orders.

## 2015-09-29 NOTE — Progress Notes (Signed)
no change in labs

## 2015-09-29 NOTE — Progress Notes (Signed)
Report obtained from [facility] in ED.

## 2015-09-29 NOTE — Progress Notes (Signed)
Na+ 170 and Cl- >130. Paged NP Kathline Magic about it and no new orders received. NP did tell RN that there was no need to call elevated Na+ since MD aware and plan to decrease level slowly.

## 2015-09-29 NOTE — Clinical Social Work Note (Signed)
Clinical Social Work Assessment  Patient Details  Name: [name] MRN: [id number] Date of Birth: August 27, 1922  Date of referral:  09/29/15               Reason for consult:  Facility Placement                Permission sought to share information with:  Facility Art therapist granted to share information::  Yes, Verbal Permission Granted  Name::        Agency::     Relationship::     Contact Information:     Housing/Transportation Living arrangements for the past 2 months:  [place] of Information:  Adult Children Patient Interpreter Needed:  None Criminal Activity/Legal Involvement Pertinent to Current Situation/Hospitalization:  No - Comment as needed Significant Relationships:  Adult Children Lives with:  Facility Resident Do you feel safe going back to the place where you live?  Yes Need for family participation in patient care:  Yes (Comment)  Care giving concerns:  CSW received consult that patient was admitted from [facility] (ph#: [phone]).    Social Worker assessment / plan:  CSW confirmed with patient's son, Wille Glaser that patient plans to return to ALF at discharge.   Employment status:  Retired Forensic scientist:  Information systems manager, Medicaid In [place] PT Recommendations:  Not assessed at this time Information / Referral to community resources:     Patient/Family's Response to care:  Son was not aware patient had been admitted to the hospital, but states that she had been receiving good care while at [facility].   Patient/Family's Understanding of and Emotional Response to Diagnosis, Current Treatment, and Prognosis:    Emotional Assessment Appearance:    Attitude/Demeanor/Rapport:    Affect (typically observed):    Orientation:    Alcohol / Substance use:    Psych involvement (Current and /or in the community):     Discharge Needs  Concerns to be addressed:    Readmission within the last 30 days:     Current discharge risk:    Barriers to Discharge:      Standley Brooking, LCSW 09/29/2015, 12:08 PM
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
## 2015-09-30 DIAGNOSIS — Z66 Do not resuscitate: Secondary | ICD-10-CM

## 2015-09-30 LAB — HEPATIC FUNCTION PANEL
ALBUMIN: 2.7 g/dL — AB (ref 3.5–5.0)
ALT: 47 U/L (ref 14–54)
AST: 60 U/L — ABNORMAL HIGH (ref 15–41)
Alkaline Phosphatase: 58 U/L (ref 38–126)
BILIRUBIN TOTAL: 0.8 mg/dL (ref 0.3–1.2)
Bilirubin, Direct: 0.2 mg/dL (ref 0.1–0.5)
Indirect Bilirubin: 0.6 mg/dL (ref 0.3–0.9)
Total Protein: 5.5 g/dL — ABNORMAL LOW (ref 6.5–8.1)

## 2015-09-30 LAB — BASIC METABOLIC PANEL
ANION GAP: 10 (ref 5–15)
ANION GAP: 12 (ref 5–15)
BUN: 55 mg/dL — ABNORMAL HIGH (ref 6–20)
BUN: 43 mg/dL — ABNORMAL HIGH (ref 6–20)
CALCIUM: 8.2 mg/dL — AB (ref 8.9–10.3)
CALCIUM: 8.3 mg/dL — AB (ref 8.9–10.3)
CHLORIDE: 123 mmol/L — AB (ref 101–111)
CO2: 25 mmol/L (ref 22–32)
CO2: 22 mmol/L (ref 22–32)
CREATININE: 1.16 mg/dL — AB (ref 0.44–1.00)
CREATININE: 0.94 mg/dL (ref 0.44–1.00)
Chloride: 127 mmol/L — ABNORMAL HIGH (ref 101–111)
GFR calc non Af Amer: 51 mL/min — ABNORMAL LOW (ref >60)
GFR, EST AFRICAN AMERICAN: 46 mL/min — AB (ref >60)
GFR, EST AFRICAN AMERICAN: 60 mL/min — AB (ref >60)
GFR, EST NON AFRICAN AMERICAN: 40 mL/min — AB (ref >60)
Glucose, Bld: 142 mg/dL — ABNORMAL HIGH (ref 65–99)
Glucose, Bld: 91 mg/dL (ref 65–99)
Potassium: 3.4 mmol/L — ABNORMAL LOW (ref 3.5–5.1)
Potassium: 3.5 mmol/L (ref 3.5–5.1)
SODIUM: 162 mmol/L — AB (ref 135–145)
SODIUM: 157 mmol/L — AB (ref 135–145)

## 2015-09-30 LAB — UREA NITROGEN, URINE: UREA NITROGEN UR: 1096 mg/dL

## 2015-09-30 MED ORDER — SODIUM CHLORIDE 0.45 % IV SOLN
INTRAVENOUS | Status: DC
  Administered 2015-09-30 – 2015-10-01 (×2): via INTRAVENOUS

## 2015-09-30 MED ORDER — ENSURE ENLIVE PO LIQD: 237.0000 mL | ORAL | Status: DC | PRN

## 2015-09-30 NOTE — Progress Notes (Addendum)
[facility] PROGRESS NOTE    Progress Note   [name] [id number] DOB: 1922-08-04 DOA: 09/28/2015 PCP: No primary care provider on file.   Brief Narrative:   [name] is an 82 y.o. female with PMH of complete heart block status post pacer placement, advanced Alzheimer's dementia with bedbound bound status, and hypothyroidism who presents from her SNF via EMS following an unwitnessed fall with resulting forehead abrasion. The patient apparently fell out of her bed sometime today and was noted by her SNF caretaker on the floor with a central forehead abrasion. She is nonverbal and bedbound at baseline but had assisted with her feeding up until 1-2 weeks ago,   Assessment/Plan:  Acute toxic metabolic encephalopathy due to Hypernatremia and possibly UTI: Probably multifactorial, in patient with advanced dementia, with hypernatremia and possibly UTI Change IV fluids to normal saline, continue basic metabolic panel every 2 hours, will try to decrease sodium no more than 14.224 hour period. Urine cultures are pending she slowly improving from a mentation standpoint. Appreciate palliative care's assistance on this case to try progressively admissions.  Acute kidney injury: Creatinine is slowly improving, continue half-normal saline infusion strict I's and O's.  Leukocytosis: Continue Rocephin urine cultures are pending.  Thrombocytopenia: Question is do to infectious etiology.  Macrocytic anemia: B-12 500, TSH is 1 with elevated T4 probably due to sick euthyroid syndrome, RBC folate is pending.  Chronic atrial fibrillation: In sinus rhythm, an antegrade ablation as an outpatient.  Unwitnessed fall: check a CK CT scan of the head and C-spine showed no acute findings.  Protein calorie malnutrition (HCC)    DVT Prophylaxis - Lovenox ordered.  Family Communication: none Disposition Plan: Home 1- 2 days Code Status:     Code Status Orders        Start      Ordered   09/28/15 1946  Full code   Continuous     09/28/15 1954    Code Status History    Date Active Date Inactive Code Status Order ID Comments User Context   This patient has a current code status but no historical code status.        IV Access:    Peripheral IV   Procedures and diagnostic studies:   Ct Head Wo Contrast  09/28/2015  CLINICAL DATA:  Unwitnessed fall tonight. EXAM: CT HEAD WITHOUT CONTRAST CT CERVICAL SPINE WITHOUT CONTRAST TECHNIQUE: Multidetector CT imaging of the head and cervical spine was performed following the standard protocol without intravenous contrast. Multiplanar CT image reconstructions of the cervical spine were also generated. COMPARISON:  01/09/2013 FINDINGS: CT HEAD FINDINGS Stable age related cerebral atrophy, ventriculomegaly and periventricular white matter disease. Stable dense basal ganglia calcifications. No extra-axial fluid collections are identified. No CT findings for acute hemispheric infarction or intracranial hemorrhage. No mass lesions. The brainstem and cerebellum are normal. No acute skull fracture. The paranasal sinuses and mastoid air cells are grossly clear. The globes are intact. CT CERVICAL SPINE FINDINGS Normal alignment of the cervical vertebral bodies. Exaggerated cervical lordosis. No acute fracture. The facets are normally aligned. Moderate C1-2 degenerative changes but no dens fracture. The skullbase C1 and C1-2 articulations are maintained. The lung apices are grossly clear. IMPRESSION: Stable severe cerebral atrophy, ventriculomegaly and periventricular white matter disease. No acute intracranial findings or skull fracture. No acute cervical spine fracture. Electronically Signed   By: Marijo Sanes M.D.   On: 09/28/2015 18:36   Ct Cervical Spine Wo Contrast  09/28/2015  CLINICAL DATA:  Unwitnessed fall tonight. EXAM: CT HEAD WITHOUT CONTRAST CT CERVICAL SPINE WITHOUT CONTRAST TECHNIQUE: Multidetector CT imaging of the head and  cervical spine was performed following the standard protocol without intravenous contrast. Multiplanar CT image reconstructions of the cervical spine were also generated. COMPARISON:  01/09/2013 FINDINGS: CT HEAD FINDINGS Stable age related cerebral atrophy, ventriculomegaly and periventricular white matter disease. Stable dense basal ganglia calcifications. No extra-axial fluid collections are identified. No CT findings for acute hemispheric infarction or intracranial hemorrhage. No mass lesions. The brainstem and cerebellum are normal. No acute skull fracture. The paranasal sinuses and mastoid air cells are grossly clear. The globes are intact. CT CERVICAL SPINE FINDINGS Normal alignment of the cervical vertebral bodies. Exaggerated cervical lordosis. No acute fracture. The facets are normally aligned. Moderate C1-2 degenerative changes but no dens fracture. The skullbase C1 and C1-2 articulations are maintained. The lung apices are grossly clear. IMPRESSION: Stable severe cerebral atrophy, ventriculomegaly and periventricular white matter disease. No acute intracranial findings or skull fracture. No acute cervical spine fracture. Electronically Signed   By: Marijo Sanes M.D.   On: 09/28/2015 18:36     Medical Consultants:    None.  Anti-Infectives:   Anti-infectives    Start     Dose/Rate Route Frequency Ordered Stop   09/29/15 0600  cefTRIAXone (ROCEPHIN) 1 g in dextrose 5 % 50 mL IVPB     1 g 100 mL/hr over 30 Minutes Intravenous Every 24 hours 09/29/15 0548        Subjective:    [name] patient is awake today talking but still confused  Objective:    Filed Vitals:   09/29/15 0625 09/29/15 1412 09/29/15 2150 09/30/15 0500  BP: 102/43 98/45 106/56 90/45  Pulse: 59 61 70 76  Temp: 97.5 F (36.4 C) 97.7 F (36.5 C) 98.1 F (36.7 C) 97.5 F (36.4 C)  TempSrc: Oral Oral Axillary Oral  Resp: 14 16 16 16   Weight: 45.8 kg (100 lb 15.5 oz)   21.138 kg (46 lb 9.6 oz)  SpO2:  100% 92% 100% 100%   No intake or output data in the 24 hours ending 09/30/15 0859 Filed Weights   09/28/15 2143 09/29/15 0625 09/30/15 0500  Weight: 46.7 kg (102 lb 15.3 oz) 45.8 kg (100 lb 15.5 oz) 21.138 kg (46 lb 9.6 oz)    Exam: Gen:  NAD, alert and oriented only to person Cardiovascular:  RRR,  Chest and lungs:   Good air movement clear to auscultation Abdomen:  Abdomen is soft nondistended positive bowel sounds, she grimaces  when you press on the suprapubic area Extremities:  No edema   Data Reviewed:    Labs: Basic Metabolic Panel:  Recent Labs Lab 09/29/15 0210 09/29/15 0418 09/29/15 0646 09/29/15 0939 09/30/15 0200  NA 169* 169* 168* 166* 162*  K 3.5 3.4* 3.2* 3.6 3.4*  CL >130* >130* >130* >130* 127*  CO2 25 26 25  21* 25  GLUCOSE 134* 129* 127* 115* 142*  BUN 83* 82* 76* 70* 55*  CREATININE 1.48* 1.44* 1.30* 1.24* 1.16*  CALCIUM 8.5* 8.3* 8.3* 8.2* 8.2*   GFR CrCl cannot be calculated (Unknown ideal weight.). Liver Function Tests:  Recent Labs Lab 09/30/15 0200  AST 60*  ALT 47  ALKPHOS 58  BILITOT 0.8  PROT 5.5*  ALBUMIN 2.7*   No results for input(s): LIPASE, AMYLASE in the last 168 hours. No results for input(s): AMMONIA in the last 168 hours. Coagulation profile  Recent Labs Lab 09/28/15 2039  INR 1.14    CBC:  Recent Labs Lab 09/28/15 1758 09/29/15 0418  WBC 10.6* 9.2  NEUTROABS 6.6 5.8  HGB 12.6 11.0*  HCT 42.6 37.0  MCV 119.7* 116.0*  PLT 77* 66*   Cardiac Enzymes: No results for input(s): CKTOTAL, CKMB, CKMBINDEX, TROPONINI in the last 168 hours. BNP (last 3 results) No results for input(s): PROBNP in the last 8760 hours. CBG: No results for input(s): GLUCAP in the last 168 hours. D-Dimer: No results for input(s): DDIMER in the last 72 hours. Hgb A1c: No results for input(s): HGBA1C in the last 72 hours. Lipid Profile: No results for input(s): CHOL, HDL, LDLCALC, TRIG, CHOLHDL, LDLDIRECT in the last 72  hours. Thyroid function studies:  Recent Labs  09/29/15 0418  TSH 1.018   Anemia work up:  Recent Labs  09/28/15 0210 09/29/15 0418  VITAMINB12 555  --   FERRITIN 952*  --   TIBC 192*  --   IRON 43  --   RETICCTPCT  --  1.6   Sepsis Labs:  Recent Labs Lab 09/28/15 1758 09/29/15 0418  WBC 10.6* 9.2   Microbiology Recent Results (from the past 240 hour(s))  MRSA PCR Screening     Status: Abnormal   Collection Time: 09/29/15 12:32 AM  Result Value Ref Range Status   MRSA by PCR POSITIVE (A) NEGATIVE Final    Comment:        The GeneXpert MRSA Assay (FDA approved for NASAL specimens only), is one component of a comprehensive MRSA colonization surveillance program. It is not intended to diagnose MRSA infection nor to guide or monitor treatment for MRSA infections. RESULT CALLED TO, READ BACK BY AND VERIFIED WITHRevonda Humphrey RN [id number] 09/29/15 A NAVARRO      Medications:   . antiseptic oral rinse  7 mL Mouth Rinse q12n4p  . cefTRIAXone (ROCEPHIN)  IV  1 g Intravenous Q24H  . chlorhexidine  15 mL Mouth Rinse BID  . Chlorhexidine Gluconate Cloth  6 each Topical Q0600  . fluticasone  1 spray Each Nare Daily  . levothyroxine  50 mcg Oral QAC breakfast  . lubriderm seriously sensitive  1 application Topical q morning - 10a  . multivitamin  5 mL Oral Daily  . mupirocin ointment  1 application Nasal BID  . polyethylene glycol  17 g Oral Daily  . polyvinyl alcohol  1 drop Both Eyes QID  . traMADol  25 mg Oral QHS   Continuous Infusions:    Time spent: 25 min   LOS: 2 days   Charlynne Cousins  [facility] Pager [phone]  *Please refer to [email or web address], password [email or web address] to get updated schedule on who will round on this patient, as hospitalists switch teams weekly. If 7PM-7AM, please contact night-coverage at [email or web address], password [email or web address] for any overnight needs.  09/30/2015, 8:59 AM

## 2015-09-30 NOTE — Progress Notes (Signed)
Utilization review completed.  

## 2015-09-30 NOTE — Progress Notes (Signed)
CRITICAL VALUE ALERT  Critical value received:  Sodium 162  Date of notification:  09/30/15  Time of notification:  0253  Critical value read back:Yes.    Nurse who received alert:  Hortencia Conradi RN  MD notified (1st page):  Per MD, do not need to call values in unless higher, as they are aware and trying to decrease levels slowly  Time of first page:    MD notified (2nd page):  Time of second page:  Responding MD:    Time MD responded:

## 2015-09-30 NOTE — Progress Notes (Signed)
Initial Nutrition Assessment  DOCUMENTATION CODES:   Severe malnutrition in context of acute illness/injury  INTERVENTION:   Provide Ensure Enlive po BID, each supplement provides 350 kcal and 20 grams of protein RD to continue to monitor for plan and GOC  NUTRITION DIAGNOSIS:   Malnutrition related to acute illness, lethargy/confusion as evidenced by severe depletion of muscle mass, energy intake < or equal to 50% for > or equal to 5 days.  GOAL:   Patient will meet greater than or equal to 90% of their needs  MONITOR:   PO intake, Supplement acceptance, Labs, Weight trends, Skin, I & O's , GOC  REASON FOR ASSESSMENT:   Consult Assessment of nutrition requirement/status  ASSESSMENT:   82 y.o. female with PMH of complete heart block status post pacer placement, advanced Alzheimer's dementia with bedbound bound status, and hypothyroidism who presents from her SNF via EMS following an unwitnessed fall with resulting forehead abrasion. The patient apparently fell out of her bed sometime today and was noted by her SNF caretaker on the floor with a central forehead abrasion. She is nonverbal and bedbound at baseline but had assisted with her feeding up until 1-2 weeks ago,   Pt in room with no family at bedside. Pt nonverbal. RN reports pt has not been eating or drinking, she is alert today. Pt did not want RD to perform exam, RD was able to see depletion in clavicle, temporal and shoulder regions. Unsure when weight loss began for patient but she has lost 30 lb over 1 year. Palliative care is following for [place] decisions.   Labs reviewed: Elevated Na, BUN, Creatinine Low K  Diet Order:  Diet NPO time specified  Skin:  Wound (see comment) (Stage II hip ulcer)  Last BM:  1/21  Height:   Ht Readings from Last 1 Encounters:  09/30/15 4\' 11"  (1.499 m)    Weight: error, 1/21: 100 lb (45.8 kg)  Wt Readings from Last 1 Encounters:  09/30/15 46 lb 9.6 oz (21.138 kg)    Ideal  Body Weight:  44.8 kg  BMI:  Body mass index is 9.41 kg/(m^2).  Estimated Nutritional Needs:   Kcal:  1200-1400  Protein:  60-70g  Fluid:  1.4L/day  EDUCATION NEEDS:   No education needs identified at this time  Clayton Bibles, MS, RD, LDN Pager: [phone] After Hours Pager: [phone]

## 2015-09-30 NOTE — Progress Notes (Signed)
Fl2 initiated. Awaiting PT eval to determine level of care needs. Current recommendation of [place].  Patient is 46 lbs and is unable to communicate verbally. She is confused all spheres. CSW services will continue to monitor for potential d/c needs.  Lorie Phenix. Pauline Good, LCSW [phone] (weekend coverage)

## 2015-09-30 NOTE — Progress Notes (Signed)
Chaplain visit the result of a [place] in Ms Mcconico's chart.  The consult indicates:  Son Broadus John felt his mother would appreciate a chaplain visit. She is a religious person.     Ms Lickliter was sleeping when the chaplain visited and on the advice of staff the chaplain did not disturb her.  Request weekday chaplains follow-up as available.  Sallee Lange. Blakely Gluth, DMin, MDiv Chaplain

## 2015-10-01 DIAGNOSIS — W19XXXD Unspecified fall, subsequent encounter: Secondary | ICD-10-CM

## 2015-10-01 DIAGNOSIS — G300 Alzheimer's disease with early onset: Secondary | ICD-10-CM

## 2015-10-01 DIAGNOSIS — I481 Persistent atrial fibrillation: Secondary | ICD-10-CM

## 2015-10-01 DIAGNOSIS — F0281 Dementia in other diseases classified elsewhere with behavioral disturbance: Secondary | ICD-10-CM

## 2015-10-01 DIAGNOSIS — N179 Acute kidney failure, unspecified: Secondary | ICD-10-CM

## 2015-10-01 DIAGNOSIS — E870 Hyperosmolality and hypernatremia: Secondary | ICD-10-CM

## 2015-10-01 LAB — BASIC METABOLIC PANEL
ANION GAP: 11 (ref 5–15)
ANION GAP: 8 (ref 5–15)
BUN: 36 mg/dL — AB (ref 6–20)
BUN: 33 mg/dL — AB (ref 6–20)
CALCIUM: 8 mg/dL — AB (ref 8.9–10.3)
CO2: 21 mmol/L — AB (ref 22–32)
CO2: 24 mmol/L (ref 22–32)
Calcium: 7.9 mg/dL — ABNORMAL LOW (ref 8.9–10.3)
Chloride: 122 mmol/L — ABNORMAL HIGH (ref 101–111)
Chloride: 119 mmol/L — ABNORMAL HIGH (ref 101–111)
Creatinine, Ser: 0.86 mg/dL (ref 0.44–1.00)
Creatinine, Ser: 1.02 mg/dL — ABNORMAL HIGH (ref 0.44–1.00)
GFR calc Af Amer: >60 mL/min (ref >60)
GFR calc Af Amer: 54 mL/min — ABNORMAL LOW (ref >60)
GFR calc non Af Amer: 57 mL/min — ABNORMAL LOW (ref >60)
GFR calc non Af Amer: 47 mL/min — ABNORMAL LOW (ref >60)
GLUCOSE: 85 mg/dL (ref 65–99)
GLUCOSE: 126 mg/dL — AB (ref 65–99)
POTASSIUM: 3.4 mmol/L — AB (ref 3.5–5.1)
Potassium: 3.4 mmol/L — ABNORMAL LOW (ref 3.5–5.1)
Sodium: 154 mmol/L — ABNORMAL HIGH (ref 135–145)
Sodium: 151 mmol/L — ABNORMAL HIGH (ref 135–145)

## 2015-10-01 LAB — FOLATE RBC
FOLATE, HEMOLYSATE: 486.1 ng/mL
Folate, RBC: 1317 ng/mL (ref >498)
HEMATOCRIT: 36.9 % (ref 34.0–46.6)

## 2015-10-01 LAB — URINE CULTURE: Culture: >=100000

## 2015-10-01 LAB — OSMOLALITY: OSMOLALITY: 386 mosm/kg — AB (ref 275–295)

## 2015-10-01 MED ORDER — DEXTROSE 5 % IV SOLN
INTRAVENOUS | Status: AC
  Administered 2015-10-01 – 2015-10-02 (×3): via INTRAVENOUS

## 2015-10-01 NOTE — Consult Note (Signed)
WOC wound consult note Reason for Consult:Pressure injury to right hip Wound type:Pressure Pressure Ulcer POA: Yes Measurement:3cm x 3.5cm x 0.2cm with a satellite lesion recently reepithelialized measuring 1cm round at 7 o'clock. Wound DO:[id number], pink, moist Drainage (amount, consistency, odor) scant serous Periwound:intact Dressing procedure/placement/frequency:Patient is wet with urine and there is evidence of moisture associated skin damage (MASD).  For this reason, I will employ a support surface with low air loss feature.  Patient is contracted and has a pressure injury on the right hip, she must occasionally be positioned on this side and a bed will reduce pressure while in that position. House products are to be used in skin care regimen, cleanser, moisturizer and moisture barrier.  NO disposable briefs or disposable underpads. [place] nursing team will not follow, but will remain available to this patient, the nursing and medical teams.  Please re-consult if needed. Thanks, Maudie Flakes, MSN, RN, [place], Arther Abbott  Pager# [phone]

## 2015-10-01 NOTE — Care Management Important Message (Addendum)
Important Message  Patient Details IM Letter given to Nora/Case Manager to present to Patient. Name: Everlyn Gupta MRN: [id number] Date of Birth: [birth year]   Medicare Important Message Given:  Yes    Camillo Flaming 10/01/2015, 11:59 AMImportant Message  Patient Details  Name: Neviah Tobey MRN: [id number] Date of Birth: 1922/08/22   Medicare Important Message Given:  Yes    Camillo Flaming 10/01/2015, 11:59 AM

## 2015-10-01 NOTE — Progress Notes (Signed)
[facility] PROGRESS NOTE    Progress Note   Leeta Butterly [id number] DOB: September 06, 1922 DOA: 09/28/2015 PCP: No primary care provider on file.   Brief Narrative:   Jari Menges is an 82 y.o. female with PMH of complete heart block status post pacer placement, advanced Alzheimer's dementia with bedbound bound status, and hypothyroidism who presents from her SNF via EMS following an unwitnessed fall with resulting forehead abrasion. The patient apparently fell out of her bed sometime today and was noted by her SNF caretaker on the floor with a central forehead abrasion. She is nonverbal and bedbound at baseline but had assisted with her feeding up until 1-2 weeks ago,   Assessment/Plan:  Acute toxic metabolic encephalopathy due to Hypernatremia and possibly UTI: Probably multifactorial, in patient with advanced dementia, with hypernatremia and possibly UTI  Acute kidney injury: Creatinine is slowly improving, continue half-normal saline infusion strict I's and O's.  UTI- Leukocytosis: E coli sensitive to Rocephin- today is day 3  Thrombocytopenia: Question is do to infectious etiology- will recheck  Macrocytic anemia: B-12 500, TSH is 1 with elevated T4 probably due to sick euthyroid syndrome, RBC folate is pending.  Chronic atrial fibrillation: In sinus rhythm, an antegrade ablation as an outpatient.  Unwitnessed fall: CT scan of the head and C-spine showed no acute findings.  Protein calorie malnutrition (HCC)    DVT Prophylaxis - Lovenox ordered.  Family Communication: none Disposition Plan: SNF 1- 2 days Code Status:     Code Status Orders        Start     Ordered   09/28/15 1946  Full code   Continuous     09/28/15 1954    Code Status History    Date Active Date Inactive Code Status Order ID Comments User Context   This patient has a current code status but no historical code status.      IV Access:    Peripheral IV   Procedures and  diagnostic studies:   No results found.   Medical Consultants:    None.  Anti-Infectives:   Anti-infectives    Start     Dose/Rate Route Frequency Ordered Stop   09/29/15 0600  cefTRIAXone (ROCEPHIN) 1 g in dextrose 5 % 50 mL IVPB     1 g 100 mL/hr over 30 Minutes Intravenous Every 24 hours 09/29/15 0548        Subjective:    Kimori Zinda patient is awake today talking but still confused  Objective:    Filed Vitals:   09/30/15 1218 09/30/15 1438 09/30/15 2208 10/01/15 0656  BP:  96/50 97/44 109/55  Pulse:  76 67 59  Temp:  97.9 F (36.6 C) 96.9 F (36.1 C) 97.5 F (36.4 C)  TempSrc:  Oral Axillary Axillary  Resp:  16 16 16   Height: 4\' 11"  (1.499 m)     Weight:      SpO2:  100% 100% 97%   No intake or output data in the 24 hours ending 10/01/15 1316 Filed Weights   09/28/15 2143 09/29/15 0625 09/30/15 0500  Weight: 46.7 kg (102 lb 15.3 oz) 45.8 kg (100 lb 15.5 oz) 21.138 kg (46 lb 9.6 oz)    Exam: Gen:  NAD, alert and oriented only to person Cardiovascular:  RRR,  Chest and lungs:   Good air movement clear to auscultation Abdomen:  Abdomen is soft nondistended positive bowel sounds, she grimaces  when you press on the suprapubic area Extremities:  No edema  Data Reviewed:    Labs: Basic Metabolic Panel:  Recent Labs Lab 09/29/15 0646 09/29/15 0939 09/30/15 0200 09/30/15 1308 10/01/15 0815  NA 168* 166* 162* 157* 154*  K 3.2* 3.6 3.4* 3.5 3.4*  CL >130* >130* 127* 123* 122*  CO2 25 21* 25 22 21*  GLUCOSE 127* 115* 142* 91 85  BUN 76* 70* 55* 43* 36*  CREATININE 1.30* 1.24* 1.16* 0.94 0.86  CALCIUM 8.3* 8.2* 8.2* 8.3* 8.0*   GFR Estimated Creatinine Clearance: 14.2 mL/min (by C-G formula based on Cr of 0.86). Liver Function Tests:  Recent Labs Lab 09/30/15 0200  AST 60*  ALT 47  ALKPHOS 58  BILITOT 0.8  PROT 5.5*  ALBUMIN 2.7*   No results for input(s): LIPASE, AMYLASE in the last 168 hours. No results for input(s): AMMONIA in  the last 168 hours. Coagulation profile  Recent Labs Lab 09/28/15 2039  INR 1.14    CBC:  Recent Labs Lab 09/28/15 1758 09/29/15 0418  WBC 10.6* 9.2  NEUTROABS 6.6 5.8  HGB 12.6 11.0*  HCT 42.6 37.0  MCV 119.7* 116.0*  PLT 77* 66*   Cardiac Enzymes: No results for input(s): CKTOTAL, CKMB, CKMBINDEX, TROPONINI in the last 168 hours. BNP (last 3 results) No results for input(s): PROBNP in the last 8760 hours. CBG: No results for input(s): GLUCAP in the last 168 hours. D-Dimer: No results for input(s): DDIMER in the last 72 hours. Hgb A1c: No results for input(s): HGBA1C in the last 72 hours. Lipid Profile: No results for input(s): CHOL, HDL, LDLCALC, TRIG, CHOLHDL, LDLDIRECT in the last 72 hours. Thyroid function studies:  Recent Labs  09/29/15 0418  TSH 1.018   Anemia work up:  Recent Labs  09/29/15 0418  RETICCTPCT 1.6   Sepsis Labs:  Recent Labs Lab 09/28/15 1758 09/29/15 0418  WBC 10.6* 9.2   Microbiology Recent Results (from the past 240 hour(s))  MRSA PCR Screening     Status: Abnormal   Collection Time: 09/29/15 12:32 AM  Result Value Ref Range Status   MRSA by PCR POSITIVE (A) NEGATIVE Final    Comment:        The GeneXpert MRSA Assay (FDA approved for NASAL specimens only), is one component of a comprehensive MRSA colonization surveillance program. It is not intended to diagnose MRSA infection nor to guide or monitor treatment for MRSA infections. RESULT CALLED TO, READ BACK BY AND VERIFIED WITH: Revonda Humphrey RN [id number] 09/29/15 A NAVARRO   Urine culture     Status: None   Collection Time: 09/29/15  2:37 AM  Result Value Ref Range Status   Specimen Description URINE, CATHETERIZED  Final   Special Requests NONE  Final   Culture   Final    >=100,000 COLONIES/mL ESCHERICHIA COLI Performed at [facility]    Report Status 10/01/2015 FINAL  Final   Organism ID, Bacteria ESCHERICHIA COLI  Final      Susceptibility   Escherichia  coli - MIC*    AMPICILLIN <=2 SENSITIVE Sensitive     CEFAZOLIN <=4 SENSITIVE Sensitive     CEFTRIAXONE <=1 SENSITIVE Sensitive     CIPROFLOXACIN <=0.25 SENSITIVE Sensitive     GENTAMICIN <=1 SENSITIVE Sensitive     IMIPENEM <=0.25 SENSITIVE Sensitive     NITROFURANTOIN <=16 SENSITIVE Sensitive     TRIMETH/SULFA <=20 SENSITIVE Sensitive     AMPICILLIN/SULBACTAM <=2 SENSITIVE Sensitive     PIP/TAZO <=4 SENSITIVE Sensitive     * >=100,000 COLONIES/mL ESCHERICHIA COLI  Medications:   . antiseptic oral rinse  7 mL Mouth Rinse q12n4p  . cefTRIAXone (ROCEPHIN)  IV  1 g Intravenous Q24H  . chlorhexidine  15 mL Mouth Rinse BID  . Chlorhexidine Gluconate Cloth  6 each Topical Q0600  . fluticasone  1 spray Each Nare Daily  . levothyroxine  50 mcg Oral QAC breakfast  . lubriderm seriously sensitive  1 application Topical q morning - 10a  . multivitamin  5 mL Oral Daily  . mupirocin ointment  1 application Nasal BID  . polyethylene glycol  17 g Oral Daily  . polyvinyl alcohol  1 drop Both Eyes QID  . traMADol  25 mg Oral QHS   Continuous Infusions: . dextrose 110 mL/hr at 10/01/15 [id number]    Time spent: 25 min   LOS: 3 days   Debbe Odea, MD [facility] Pager [phone]  *Please refer to [email or web address], password [email or web address] to get updated schedule on who will round on this patient, as hospitalists switch teams weekly. If 7PM-7AM, please contact night-coverage at [email or web address], password [email or web address] for any overnight needs.  10/01/2015, 1:16 PM

## 2015-10-02 LAB — BASIC METABOLIC PANEL
ANION GAP: 10 (ref 5–15)
BUN: 27 mg/dL — ABNORMAL HIGH (ref 6–20)
CALCIUM: 8.2 mg/dL — AB (ref 8.9–10.3)
CHLORIDE: 117 mmol/L — AB (ref 101–111)
CO2: 21 mmol/L — AB (ref 22–32)
Creatinine, Ser: 0.93 mg/dL (ref 0.44–1.00)
GFR calc Af Amer: >60 mL/min (ref >60)
GFR calc non Af Amer: 52 mL/min — ABNORMAL LOW (ref >60)
GLUCOSE: 150 mg/dL — AB (ref 65–99)
Potassium: 3.9 mmol/L (ref 3.5–5.1)
Sodium: 148 mmol/L — ABNORMAL HIGH (ref 135–145)

## 2015-10-02 LAB — CBC
HCT: 38.3 % (ref 36.0–46.0)
HEMOGLOBIN: 12.1 g/dL (ref 12.0–15.0)
MCH: 34.9 pg — AB (ref 26.0–34.0)
MCHC: 31.6 g/dL (ref 30.0–36.0)
MCV: 110.4 fL — AB (ref 78.0–100.0)
Platelets: 78 10*3/uL — ABNORMAL LOW (ref 150–400)
RBC: 3.47 MIL/uL — ABNORMAL LOW (ref 3.87–5.11)
RDW: 14.3 % (ref 11.5–15.5)
WBC: 7.6 10*3/uL (ref 4.0–10.5)

## 2015-10-02 MED ORDER — TRAMADOL HCL 50 MG PO TABS: 25.0000 mg | ORAL_TABLET | Freq: Every day | ORAL | Status: AC

## 2015-10-02 MED ORDER — SODIUM CHLORIDE 0.45 % IV SOLN
INTRAVENOUS | Status: DC
Start: 2015-10-02 — End: 2015-10-02

## 2015-10-02 MED ORDER — CEPHALEXIN 500 MG PO CAPS: 500.0000 mg | ORAL_CAPSULE | Freq: Four times a day (QID) | ORAL | Status: AC

## 2015-10-02 NOTE — NC FL2 (Signed)
[place] LEVEL OF CARE SCREENING TOOL     IDENTIFICATION  Patient Name: [name] Birthdate: Jun 21, 1922 Sex: female Admission Date (Current Location): 09/28/2015  [facility] and [place] Number:  Herbalist and Address:  [facility],  [place], [place]      Provider Number: [id number]  Attending Physician Name and Address:  Charlynne Cousins, MD  Relative Name and Phone Number:       Current Level of Care: Hospital Recommended Level of Care: [place] Prior Approval Number:    Date Approved/Denied:   PASRR Number: [id number]  Discharge Plan: SNF    Current Diagnoses: Patient Active Problem List   Diagnosis Date Noted  . DNR (do not resuscitate) 09/29/2015  . Palliative care encounter 09/29/2015  . Alzheimer's disease 09/29/2015  . Loss of weight 09/29/2015  . Pressure ulcer 09/29/2015  . Hypernatremia 09/28/2015  . Acute kidney injury ([place]) 09/28/2015  . Hyperchloremia 09/28/2015  . Macrocytosis without anemia 09/28/2015  . Dehydration 09/28/2015  . Fall at nursing home 09/28/2015  . Contusion of forehead 09/28/2015  . Protein calorie malnutrition ([place]) 09/28/2015  . Hypothyroidism 08/20/2010  . DYSLIPIDEMIA 08/20/2010  . HYPOKALEMIA 08/20/2010  . ANEMIA, MACROCYTIC 08/20/2010  . THROMBOCYTOPENIA 08/20/2010  . FIBRILLATION, ATRIAL 08/20/2010  . GERD 08/20/2010  . DELIRIUM 08/20/2010  . PPM-Medtronic 08/20/2010    Orientation RESPIRATION BLADDER Height & Weight     (disoriented x 4)  Normal Incontinent 4\' 11"  (149.9 cm) 91 lbs.  BEHAVIORAL SYMPTOMS/MOOD NEUROLOGICAL BOWEL NUTRITION STATUS   (no behaviors)  (NONE) Incontinent Diet (Diet Clear Liquid)  AMBULATORY STATUS COMMUNICATION OF NEEDS Skin   Extensive Assist Verbally (speech in incomprehensible) PU Stage and Appropriate Care   PU Stage 2 Dressing: Daily (Stage 2 pressure injury of the right hip: Cleanse with NS, pat gently  dry. Cover with soft silicone foam dressing (Allevyn 5x5). Change when soiled or if edges of dressing roll or lift. Turn side to side. Low loss pressure air mattress.)                   Personal Care Assistance Level of Assistance  Total care           Functional Limitations Info  Sight, Speech, Hearing Sight Info: Adequate Hearing Info: Adequate Speech Info: Impaired    SPECIAL CARE FACTORS FREQUENCY  PT (By licensed PT), OT (By licensed OT)     PT Frequency: 5 x a week OT Frequency: 5 x a week            Contractures      Additional Factors Info  Code Status, Allergies, Isolation Precautions Code Status Info: DNR code status Allergies Info: NKDA     Isolation Precautions Info: Contact Precautions: MRSA by PCR     Current Medications (10/02/2015):  This is the current hospital active medication list Current Facility-Administered Medications  Medication Dose Route Frequency Provider Last Rate Last Dose  . acetaminophen (TYLENOL) tablet 650 mg  650 mg Oral Q6H PRN Vianne Bulls, MD       Or  . acetaminophen (TYLENOL) suppository 650 mg  650 mg Rectal Q6H PRN Vianne Bulls, MD      . antiseptic oral rinse (CPC / CETYLPYRIDINIUM CHLORIDE 0.05%) solution 7 mL  7 mL Mouth Rinse q12n4p Charlynne Cousins, MD   7 mL at 10/01/15 1600  . bisacodyl (DULCOLAX) EC tablet 5 mg  5 mg Oral Daily PRN  Ilene Qua Opyd, MD      . cefTRIAXone (ROCEPHIN) 1 g in dextrose 5 % 50 mL IVPB  1 g Intravenous Q24H Dorrene German, RPH   1 g at 10/02/15 0540  . chlorhexidine (PERIDEX) 0.12 % solution 15 mL  15 mL Mouth Rinse BID Charlynne Cousins, MD   15 mL at 10/01/15 1000  . Chlorhexidine Gluconate Cloth 2 % PADS 6 each  6 each Topical Q0600 Vianne Bulls, MD   6 each at 10/01/15 1000  . feeding supplement (ENSURE ENLIVE) (ENSURE ENLIVE) liquid 237 mL  237 mL Oral PRN Clayton Bibles, RD      . fluticasone (FLONASE) 50 MCG/ACT nasal spray 1 spray  1 spray Each Nare Daily Vianne Bulls, MD   1 spray at 10/01/15 0941  . HYDROcodone-acetaminophen (NORCO/VICODIN) 5-325 MG per tablet 1-2 tablet  1-2 tablet Oral Q4H PRN Ilene Qua Opyd, MD      . levothyroxine (SYNTHROID, LEVOTHROID) tablet 50 mcg  50 mcg Oral QAC breakfast Vianne Bulls, MD   50 mcg at 09/29/15 0800  . lubriderm seriously sensitive lotion 1 application  1 application Topical q morning - 10a Vianne Bulls, MD   1 application at 0000000 1000  . morphine 2 MG/ML injection 1 mg  1 mg Intravenous Q3H PRN Vianne Bulls, MD      . multivitamin liquid 5 mL  5 mL Oral Daily Ilene Qua Opyd, MD   5 mL at 09/29/15 1000  . mupirocin ointment (BACTROBAN) 2 % 1 application  1 application Nasal BID Vianne Bulls, MD   1 application at 0000000 2102  . ondansetron (ZOFRAN) tablet 4 mg  4 mg Oral Q6H PRN Vianne Bulls, MD       Or  . ondansetron (ZOFRAN) injection 4 mg  4 mg Intravenous Q6H PRN Ilene Qua Opyd, MD      . polyethylene glycol (MIRALAX / GLYCOLAX) packet 17 g  17 g Oral Daily Vianne Bulls, MD   17 g at 09/29/15 1000  . polyvinyl alcohol (LIQUIFILM TEARS) 1.4 % ophthalmic solution 1 drop  1 drop Both Eyes QID Vianne Bulls, MD   1 drop at 10/01/15 1735  . senna-docusate (Senokot-S) tablet 1 tablet  1 tablet Oral QHS PRN Vianne Bulls, MD      . traMADol Veatrice Bourbon) tablet 25 mg  25 mg Oral QHS Vianne Bulls, MD   25 mg at 09/28/15 2135     Discharge Medications: Please see discharge summary for a list of discharge medications.  Relevant Imaging Results:  Relevant Lab Results:   Additional Information SSN:  [id number]  Zooey Schreurs A, LCSW

## 2015-10-02 NOTE — Progress Notes (Signed)
CSW continuing to follow.   Pt admitted from [facility]. PT eval pending. CSW spoke with [facility] and facility feels that pt needs rehab and then ALF will reassess pt from rehab to determine if pt will be able to return to [facility].   CSW contacted pt son, [name] via telephone. CSW introduced self and explained role. CSW discussed with pt son that PT eval is pending, but per discussion with [facility] facility feel pt needs higher level of care upon discharge. CSW clarified pt son's questions about if pt will be able to return to [facility] eventually and explained that it would be dependent on pt progress and facilities re-evaluation after pt has been in SNF. Pt son states that he wants pt to get better and appears pt son may not have clear understanding of pt condition. Pt son is agreeable to [facility] search.  CSW completed FL2 and initiated SNF search to [facility].   CSW to follow up with pt son re: SNF bed offers.  CSW to continue to follow to provide support and assist with pt disposition planning.   Alison Murray, MSW, [place] Work [phone]

## 2015-10-02 NOTE — Progress Notes (Signed)
Pt slept the whole night. Still drowsy and non interactive

## 2015-10-02 NOTE — Clinical Social Work Placement (Signed)
   CLINICAL SOCIAL WORK PLACEMENT  NOTE  Date:  10/02/2015  Patient Details  Name: [name] MRN: [id number] Date of Birth: 08/11/1922  Clinical Social Work is seeking post-discharge placement for this patient at the [place] level of care (*CSW will initial, date and re-position this form in  chart as items are completed):  Yes   Patient/family provided with [place] Work Department's list of facilities offering this level of care within the geographic area requested by the patient (or if unable, by the patient's family).  Yes   Patient/family informed of their freedom to choose among providers that offer the needed level of care, that participate in Medicare, Medicaid or managed care program needed by the patient, have an available bed and are willing to accept the patient.  Yes   Patient/family informed of [facility]'s ownership interest in [facility] and [facility], as well as of the fact that they are under no obligation to receive care at these facilities.  PASRR submitted to EDS on 10/02/15     PASRR number received on 10/02/15     Existing PASRR number confirmed on       FL2 transmitted to all facilities in geographic area requested by pt/family on 10/02/15     FL2 transmitted to all facilities within larger geographic area on       Patient informed that his/her managed care company has contracts with or will negotiate with certain facilities, including the following:            Patient/family informed of bed offers received.  Patient chooses bed at       Physician recommends and patient chooses bed at      Patient to be transferred to   on  .  Patient to be transferred to facility by       Patient family notified on   of transfer.  Name of family member notified:        PHYSICIAN Please sign FL2, Please sign DNR     Additional Comment:    _______________________________________________ Ladell Pier,  LCSW 10/02/2015, 11:00 AM

## 2015-10-02 NOTE — Clinical Social Work Placement (Signed)
   CLINICAL SOCIAL WORK PLACEMENT  NOTE  Date:  10/02/2015  Patient Details  Name: [name] MRN: [id number] Date of Birth: 03-27-1922  Clinical Social Work is seeking post-discharge placement for this patient at the [place] level of care (*CSW will initial, date and re-position this form in  chart as items are completed):  Yes   Patient/family provided with [place] Work Department's list of facilities offering this level of care within the geographic area requested by the patient (or if unable, by the patient's family).  Yes   Patient/family informed of their freedom to choose among providers that offer the needed level of care, that participate in Medicare, Medicaid or managed care program needed by the patient, have an available bed and are willing to accept the patient.  Yes   Patient/family informed of [facility]'s ownership interest in [facility] and [facility], as well as of the fact that they are under no obligation to receive care at these facilities.  PASRR submitted to EDS on 10/02/15     PASRR number received on 10/02/15     Existing PASRR number confirmed on       FL2 transmitted to all facilities in geographic area requested by pt/family on 10/02/15     FL2 transmitted to all facilities within larger geographic area on       Patient informed that his/her managed care company has contracts with or will negotiate with certain facilities, including the following:        Yes   Patient/family informed of bed offers received.  Patient chooses bed at [facility]     Physician recommends and patient chooses bed at      Patient to be transferred to [facility] on 10/02/15.  Patient to be transferred to facility by ambulance Corey Harold)     Patient family notified on 10/02/15 of transfer.  Name of family member notified:  pt son, Broadus John notified via telephone     PHYSICIAN Please sign FL2, Please sign DNR     Additional  Comment:    _______________________________________________ Ladell Pier, LCSW 10/02/2015, 3:28 PM

## 2015-10-02 NOTE — Progress Notes (Signed)
Pt is withdrawn, not interactive, retracts to touch, remains on her right side. Unable to take oral medication. Uncooperative with mouth care or eye drop instillation

## 2015-10-02 NOTE — Progress Notes (Signed)
CSW continuing to follow.   CSW followed up with pt son, Broadus John via telephone. CSW provided SNF bed offers. Pt son agreeable to [facility] . CSW discussed transitioning pt to [facility] this afternoon and pt son agreeable. CSW clarified pt son's questions and concerns. Pt expressed that he recognizes that he is not sure if his mother will show any improvements and CSW discussed that palliative can continue discussions at SNF regarding goals of care and involving hospice when pt son ready.   CSW contacted [place] and confirmed facility could accept pt today and have long term beds available as pt will likely need to transition to long term care.   CSW facilitated pt discharge needs including contacting facility, faxing pt discharge information via [employer], discussing with pt son, Broadus John via telephone, providing RN phone number to call report, and arranging ambulance transport for pt to [facility].   CSW updated [place] via telephone per pt son request.  No further social work needs identified at this time.  CSW signing off.   Alison Murray, MSW, [place] [facility] [phone]

## 2015-10-02 NOTE — Evaluation (Signed)
Physical Therapy Evaluation Patient Details Name: [name] MRN: [id number] DOB: [birth year] Today's Date: 10/02/2015   History of Present Illness  82 y.o. female with h/o pacemaker,  advanced Alzheimers, bedbound admitted with unwitnessed fall at ALF. Dx of hyponatremia, UTI, R hip pressure sore stage 2, AKI.   Clinical Impression  Pt admitted with above diagnosis. Pt currently with functional limitations due to the deficits listed below (see PT Problem List). Pt requires total assist for mobility. She is not able to participate in therapy due to cognitive status. SNF recommended. Pt will benefit from skilled PT to increase their independence and safety with mobility to allow discharge to the venue listed below.       Follow Up Recommendations SNF;Supervision/Assistance - 24 hour    Equipment Recommendations  None recommended by PT    Recommendations for Other Services       Precautions / Restrictions Precautions Precautions: Fall Precaution Comments: pressure ulcer R hip Restrictions Weight Bearing Restrictions: No      Mobility  Bed Mobility Overal bed mobility: Needs Assistance Bed Mobility: Supine to Sit;Sit to Supine     Supine to sit: Total assist Sit to supine: Total assist   General bed mobility comments: pt 0%, total assist for sitting balance due to posterior lean, trunk kyphotic in sitting  Transfers                    Ambulation/Gait                Stairs            Wheelchair Mobility    Modified Rankin (Stroke Patients Only)       Balance Overall balance assessment: Needs assistance Sitting-balance support: Feet supported Sitting balance-Leahy Scale: Zero Sitting balance - Comments: total assist for sitting balance                                     Pertinent Vitals/Pain Pain Assessment: Faces Faces Pain Scale: No hurt    Home Living Family/patient expects to be discharged to:: Skilled nursing  facility                      Prior Function Level of Independence: Needs assistance   Gait / Transfers Assistance Needed: bedbound per chart           Hand Dominance        Extremity/Trunk Assessment   Upper Extremity Assessment: Difficult to assess due to impaired cognition (R hand swollen, has IV in it, RN notified of possible infiltration; no functional movement of BUEs)           Lower Extremity Assessment: Difficult to assess due to impaired cognition (no functional movement of BLEs)      Cervical / Trunk Assessment: Kyphotic  Communication   Communication: Expressive difficulties;Receptive difficulties (pt verbalized but not functional communication)  Cognition Arousal/Alertness: Lethargic   Overall Cognitive Status: No family/caregiver present to determine baseline cognitive functioning (h/o advanced Alzheimers, per chart pt had been able to participate in feedings but now is not, pt not able to provide PLOF)                      General Comments      Exercises        Assessment/Plan    PT Assessment Patent does not need any further PT  services  PT Diagnosis     PT Problem List    PT Treatment Interventions     PT Goals (Current goals can be found in the Care Plan section) Acute Rehab PT Goals PT Goal Formulation: All assessment and education complete, DC therapy    Frequency     Barriers to discharge        Co-evaluation               End of Session   Activity Tolerance: Patient tolerated treatment well;No increased pain Patient left: in bed;with bed alarm set Nurse Communication: Mobility status         Time: [id number] PT Time Calculation (min) (ACUTE ONLY): 10 min   Charges:   PT Evaluation $PT Eval Moderate Complexity: 1 Procedure     PT G Codes:        Philomena Doheny 10/02/2015, 10:52 AM [phone]

## 2015-10-02 NOTE — Discharge Summary (Signed)
Physician Discharge Summary  [name] [id number] DOB: 24-Jan-1922 DOA: 09/28/2015  PCP: No primary care provider on file.  Admit date: 09/28/2015 Discharge date: 10/02/2015  Time spent: 35 minutes  Recommendations for Outpatient Follow-up:  1. Follow up with PCP in 1 week. Check a b-met. 2. Palliative care to meet with family at facility for end-of-life discussion.   Discharge Diagnoses:  Principal Problem:   Hypernatremia Active Problems:   Hypothyroidism   THROMBOCYTOPENIA   FIBRILLATION, ATRIAL   Acute kidney injury ([place])   Hyperchloremia   Macrocytosis without anemia   Dehydration   Fall at nursing home   Contusion of forehead   Protein calorie malnutrition ([place])   DNR (do not resuscitate)   Palliative care encounter   Alzheimer's disease   Loss of weight   Pressure ulcer   Discharge Condition: stable  Diet recommendation: regular  Filed Weights   09/29/15 0625 09/30/15 0500 10/02/15 0500  Weight: 45.8 kg (100 lb 15.5 oz) 21.138 kg (46 lb 9.6 oz) 41.277 kg (91 lb)    History of present illness:  82 year old with past medical history of complete heart block advanced Alzheimer's dementia found by her caretaker with a forehead abrasion and was found on the floor by her caretaker.  Hospital Course:  Acute toxic metabolic encephalopathy due to hyponatremia possibly UTI: She was treated with half-normal saline started on empiric antibiotics her sodium was decreased slowly. Urine culture came back with Escherichia coli pansensitive she was changed to Keflex which she will continue as an outpatient. Patient will continue Keflex for 5 additional days after discharge. Palliative Care to meet with family at the facility, asked the son has unrealistic gestation.  Acute kidney injury: Resolved with IV fluid hydration likely prerenal in etiology.  Thrombocytopenia: Will need a CBC in 4 weeks. Concern about MDS will follow up with PCP.   Macrocytic anemia: B-12  was 500, folate was 1300. Will need further workup as an outpatient. In combination with her thrombocytopenia MDS is included in the differential.  Procedures:  CT c- spine  CT head  Consultations:  none  Discharge Exam: Filed Vitals:   10/01/15 2236 10/02/15 0500  BP: 92/51 99/61  Pulse: 68 63  Temp: 98.1 F (36.7 C) 98.2 F (36.8 C)  Resp: 16 16    General: A&O x3 Cardiovascular: RRR Respiratory: good air movement CTA B/L  Discharge Instructions   Discharge Instructions    Diet - low sodium heart healthy    Complete by:  As directed      Increase activity slowly    Complete by:  As directed           Current Discharge Medication List    START taking these medications   Details  cephALEXin (KEFLEX) 500 MG capsule Take 1 capsule (500 mg total) by mouth 4 (four) times daily. Qty: 15 capsule, Refills: 0      CONTINUE these medications which have NOT CHANGED   Details  acetaminophen (TYLENOL) 325 MG tablet Take 325 mg by mouth 3 (three) times daily.     carboxymethylcellulose (REFRESH) 1 % ophthalmic solution Apply 1 drop to eye 4 (four) times daily.    Cranberry 250 MG CAPS Take 2 capsules by mouth 2 (two) times daily.    Emollient (EUCERIN) lotion Apply 5 mLs topically every morning. Apply to feet and ankles and lower extremities    fluticasone (FLONASE) 50 MCG/ACT nasal spray Place 1 spray into the nose every morning.  furosemide (LASIX) 20 MG tablet Take 20 mg by mouth daily.     levothyroxine (SYNTHROID, LEVOTHROID) 50 MCG tablet Take 50 mcg by mouth daily before breakfast.    Multiple Vitamins-Minerals (MULTIVITAMIN & MINERAL PO) Take 1 tablet by mouth daily.    nystatin (MYCOSTATIN) powder Apply 15 g topically 2 (two) times daily.   Associated Diagnoses: Cardiac pacemaker in situ; AF (atrial fibrillation) ([place]); Complete heart block ([place]); Pacemaker battery depletion    polyethylene glycol powder (GLYCOLAX/MIRALAX) powder Take 17 g by mouth  every morning.     potassium chloride (MICRO-K) 10 MEQ CR capsule Take 1 capsule by mouth daily.    traMADol (ULTRAM) 50 MG tablet Take 25 mg by mouth at bedtime.     ketoconazole (NIZORAL) 2 % shampoo Apply 1 application topically once a week.       No Known Allergies    The results of significant diagnostics from this hospitalization (including imaging, microbiology, ancillary and laboratory) are listed below for reference.    Significant Diagnostic Studies: Ct Head Wo Contrast  09/28/2015  CLINICAL DATA:  Unwitnessed fall tonight. EXAM: CT HEAD WITHOUT CONTRAST CT CERVICAL SPINE WITHOUT CONTRAST TECHNIQUE: Multidetector CT imaging of the head and cervical spine was performed following the standard protocol without intravenous contrast. Multiplanar CT image reconstructions of the cervical spine were also generated. COMPARISON:  01/09/2013 FINDINGS: CT HEAD FINDINGS Stable age related cerebral atrophy, ventriculomegaly and periventricular white matter disease. Stable dense basal ganglia calcifications. No extra-axial fluid collections are identified. No CT findings for acute hemispheric infarction or intracranial hemorrhage. No mass lesions. The brainstem and cerebellum are normal. No acute skull fracture. The paranasal sinuses and mastoid air cells are grossly clear. The globes are intact. CT CERVICAL SPINE FINDINGS Normal alignment of the cervical vertebral bodies. Exaggerated cervical lordosis. No acute fracture. The facets are normally aligned. Moderate C1-2 degenerative changes but no dens fracture. The skullbase C1 and C1-2 articulations are maintained. The lung apices are grossly clear. IMPRESSION: Stable severe cerebral atrophy, ventriculomegaly and periventricular white matter disease. No acute intracranial findings or skull fracture. No acute cervical spine fracture. Electronically Signed   By: Marijo Sanes M.D.   On: 09/28/2015 18:36   Ct Cervical Spine Wo Contrast  09/28/2015   CLINICAL DATA:  Unwitnessed fall tonight. EXAM: CT HEAD WITHOUT CONTRAST CT CERVICAL SPINE WITHOUT CONTRAST TECHNIQUE: Multidetector CT imaging of the head and cervical spine was performed following the standard protocol without intravenous contrast. Multiplanar CT image reconstructions of the cervical spine were also generated. COMPARISON:  01/09/2013 FINDINGS: CT HEAD FINDINGS Stable age related cerebral atrophy, ventriculomegaly and periventricular white matter disease. Stable dense basal ganglia calcifications. No extra-axial fluid collections are identified. No CT findings for acute hemispheric infarction or intracranial hemorrhage. No mass lesions. The brainstem and cerebellum are normal. No acute skull fracture. The paranasal sinuses and mastoid air cells are grossly clear. The globes are intact. CT CERVICAL SPINE FINDINGS Normal alignment of the cervical vertebral bodies. Exaggerated cervical lordosis. No acute fracture. The facets are normally aligned. Moderate C1-2 degenerative changes but no dens fracture. The skullbase C1 and C1-2 articulations are maintained. The lung apices are grossly clear. IMPRESSION: Stable severe cerebral atrophy, ventriculomegaly and periventricular white matter disease. No acute intracranial findings or skull fracture. No acute cervical spine fracture. Electronically Signed   By: Marijo Sanes M.D.   On: 09/28/2015 18:36    Microbiology: Recent Results (from the past 240 hour(s))  MRSA PCR Screening  Status: Abnormal   Collection Time: 09/29/15 12:32 AM  Result Value Ref Range Status   MRSA by PCR POSITIVE (A) NEGATIVE Final    Comment:        The GeneXpert MRSA Assay (FDA approved for NASAL specimens only), is one component of a comprehensive MRSA colonization surveillance program. It is not intended to diagnose MRSA infection nor to guide or monitor treatment for MRSA infections. RESULT CALLED TO, READ BACK BY AND VERIFIED WITH: Revonda Humphrey RN [id number] 09/29/15  A NAVARRO   Urine culture     Status: None   Collection Time: 09/29/15  2:37 AM  Result Value Ref Range Status   Specimen Description URINE, CATHETERIZED  Final   Special Requests NONE  Final   Culture   Final    >=100,000 COLONIES/mL ESCHERICHIA COLI Performed at [facility]    Report Status 10/01/2015 FINAL  Final   Organism ID, Bacteria ESCHERICHIA COLI  Final      Susceptibility   Escherichia coli - MIC*    AMPICILLIN <=2 SENSITIVE Sensitive     CEFAZOLIN <=4 SENSITIVE Sensitive     CEFTRIAXONE <=1 SENSITIVE Sensitive     CIPROFLOXACIN <=0.25 SENSITIVE Sensitive     GENTAMICIN <=1 SENSITIVE Sensitive     IMIPENEM <=0.25 SENSITIVE Sensitive     NITROFURANTOIN <=16 SENSITIVE Sensitive     TRIMETH/SULFA <=20 SENSITIVE Sensitive     AMPICILLIN/SULBACTAM <=2 SENSITIVE Sensitive     PIP/TAZO <=4 SENSITIVE Sensitive     * >=100,000 COLONIES/mL ESCHERICHIA COLI     Labs: Basic Metabolic Panel:  Recent Labs Lab 09/30/15 0200 09/30/15 1308 10/01/15 0815 10/01/15 1414 10/02/15 0144  NA 162* 157* 154* 151* 148*  K 3.4* 3.5 3.4* 3.4* 3.9  CL 127* 123* 122* 119* 117*  CO2 25 22 21* 24 21*  GLUCOSE 142* 91 85 126* 150*  BUN 55* 43* 36* 33* 27*  CREATININE 1.16* 0.94 0.86 1.02* 0.93  CALCIUM 8.2* 8.3* 8.0* 7.9* 8.2*   Liver Function Tests:  Recent Labs Lab 09/30/15 0200  AST 60*  ALT 47  ALKPHOS 58  BILITOT 0.8  PROT 5.5*  ALBUMIN 2.7*   No results for input(s): LIPASE, AMYLASE in the last 168 hours. No results for input(s): AMMONIA in the last 168 hours. CBC:  Recent Labs Lab 09/28/15 0210 09/28/15 1758 09/29/15 0418 10/02/15 0144  WBC  --  10.6* 9.2 7.6  NEUTROABS  --  6.6 5.8  --   HGB  --  12.6 11.0* 12.1  HCT 36.9 42.6 37.0 38.3  MCV  --  119.7* 116.0* 110.4*  PLT  --  77* 66* 78*   Cardiac Enzymes: No results for input(s): CKTOTAL, CKMB, CKMBINDEX, TROPONINI in the last 168 hours. BNP: BNP (last 3 results)  Recent Labs   09/28/15 2215  BNP 336.5*    ProBNP (last 3 results) No results for input(s): PROBNP in the last 8760 hours.  CBG: No results for input(s): GLUCAP in the last 168 hours.   Signed:  Charlynne Cousins MD.  [facility] 10/02/2015, 11:11 AM

## 2015-10-16 ENCOUNTER — Encounter: Payer: Self-pay | Admitting: *Deleted

## 2017-04-08 DEATH — deceased
